# Patient Record
Sex: Female | Born: 1985
Health system: Southern US, Community
[De-identification: ages and names within clinical notes are randomized; demographics above are authoritative.]

## PROBLEM LIST (undated history)

## (undated) DIAGNOSIS — R87629 Unspecified abnormal cytological findings in specimens from vagina: Secondary | ICD-10-CM

## (undated) DIAGNOSIS — E7212 Methylenetetrahydrofolate reductase deficiency: Secondary | ICD-10-CM

## (undated) DIAGNOSIS — Z9889 Other specified postprocedural states: Secondary | ICD-10-CM

## (undated) DIAGNOSIS — R112 Nausea with vomiting, unspecified: Secondary | ICD-10-CM

## (undated) DIAGNOSIS — Z1589 Genetic susceptibility to other disease: Secondary | ICD-10-CM

## (undated) DIAGNOSIS — Z789 Other specified health status: Secondary | ICD-10-CM

## (undated) HISTORY — DX: Genetic susceptibility to other disease: Z15.89

## (undated) HISTORY — DX: Other specified postprocedural states: Z98.890

## (undated) HISTORY — DX: Unspecified abnormal cytological findings in specimens from vagina: R87.629

## (undated) HISTORY — DX: Other specified postprocedural states: R11.2

---
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
## 2007-01-13 HISTORY — PX: AUGMENTATION MAMMAPLASTY: SUR837

## 2015-04-23 DIAGNOSIS — Z6827 Body mass index (BMI) 27.0-27.9, adult: Secondary | ICD-10-CM | POA: Diagnosis not present

## 2015-04-23 DIAGNOSIS — E663 Overweight: Secondary | ICD-10-CM | POA: Diagnosis not present

## 2015-05-08 DIAGNOSIS — J0300 Acute streptococcal tonsillitis, unspecified: Secondary | ICD-10-CM | POA: Diagnosis not present

## 2015-06-05 DIAGNOSIS — R300 Dysuria: Secondary | ICD-10-CM | POA: Diagnosis not present

## 2015-06-05 DIAGNOSIS — Z6827 Body mass index (BMI) 27.0-27.9, adult: Secondary | ICD-10-CM | POA: Diagnosis not present

## 2015-06-05 DIAGNOSIS — N390 Urinary tract infection, site not specified: Secondary | ICD-10-CM | POA: Diagnosis not present

## 2015-08-22 DIAGNOSIS — E663 Overweight: Secondary | ICD-10-CM | POA: Diagnosis not present

## 2015-08-22 DIAGNOSIS — Z6827 Body mass index (BMI) 27.0-27.9, adult: Secondary | ICD-10-CM | POA: Diagnosis not present

## 2015-09-30 DIAGNOSIS — D2262 Melanocytic nevi of left upper limb, including shoulder: Secondary | ICD-10-CM | POA: Diagnosis not present

## 2015-09-30 DIAGNOSIS — L814 Other melanin hyperpigmentation: Secondary | ICD-10-CM | POA: Diagnosis not present

## 2015-09-30 DIAGNOSIS — L821 Other seborrheic keratosis: Secondary | ICD-10-CM | POA: Diagnosis not present

## 2016-01-17 DIAGNOSIS — L820 Inflamed seborrheic keratosis: Secondary | ICD-10-CM | POA: Diagnosis not present

## 2016-01-20 DIAGNOSIS — F411 Generalized anxiety disorder: Secondary | ICD-10-CM | POA: Diagnosis not present

## 2016-01-22 DIAGNOSIS — F411 Generalized anxiety disorder: Secondary | ICD-10-CM | POA: Diagnosis not present

## 2016-01-27 DIAGNOSIS — F411 Generalized anxiety disorder: Secondary | ICD-10-CM | POA: Diagnosis not present

## 2016-02-03 DIAGNOSIS — F411 Generalized anxiety disorder: Secondary | ICD-10-CM | POA: Diagnosis not present

## 2016-02-12 DIAGNOSIS — F411 Generalized anxiety disorder: Secondary | ICD-10-CM | POA: Diagnosis not present

## 2016-02-17 DIAGNOSIS — F411 Generalized anxiety disorder: Secondary | ICD-10-CM | POA: Diagnosis not present

## 2016-03-02 DIAGNOSIS — F411 Generalized anxiety disorder: Secondary | ICD-10-CM | POA: Diagnosis not present

## 2016-03-11 DIAGNOSIS — F411 Generalized anxiety disorder: Secondary | ICD-10-CM | POA: Diagnosis not present

## 2016-03-16 DIAGNOSIS — Z01419 Encounter for gynecological examination (general) (routine) without abnormal findings: Secondary | ICD-10-CM | POA: Diagnosis not present

## 2016-03-16 DIAGNOSIS — Z1321 Encounter for screening for nutritional disorder: Secondary | ICD-10-CM | POA: Diagnosis not present

## 2016-03-16 DIAGNOSIS — Z6829 Body mass index (BMI) 29.0-29.9, adult: Secondary | ICD-10-CM | POA: Diagnosis not present

## 2016-03-16 DIAGNOSIS — Z130 Encounter for screening for diseases of the blood and blood-forming organs and certain disorders involving the immune mechanism: Secondary | ICD-10-CM | POA: Diagnosis not present

## 2016-03-16 DIAGNOSIS — Z0000 Encounter for general adult medical examination without abnormal findings: Secondary | ICD-10-CM | POA: Diagnosis not present

## 2016-03-16 DIAGNOSIS — Z1329 Encounter for screening for other suspected endocrine disorder: Secondary | ICD-10-CM | POA: Diagnosis not present

## 2016-03-16 DIAGNOSIS — Z13220 Encounter for screening for lipoid disorders: Secondary | ICD-10-CM | POA: Diagnosis not present

## 2016-03-16 DIAGNOSIS — F411 Generalized anxiety disorder: Secondary | ICD-10-CM | POA: Diagnosis not present

## 2016-03-16 DIAGNOSIS — R87610 Atypical squamous cells of undetermined significance on cytologic smear of cervix (ASC-US): Secondary | ICD-10-CM | POA: Diagnosis not present

## 2016-03-30 DIAGNOSIS — F411 Generalized anxiety disorder: Secondary | ICD-10-CM | POA: Diagnosis not present

## 2016-04-02 DIAGNOSIS — M9901 Segmental and somatic dysfunction of cervical region: Secondary | ICD-10-CM | POA: Diagnosis not present

## 2016-04-02 DIAGNOSIS — M9902 Segmental and somatic dysfunction of thoracic region: Secondary | ICD-10-CM | POA: Diagnosis not present

## 2016-04-02 DIAGNOSIS — M9906 Segmental and somatic dysfunction of lower extremity: Secondary | ICD-10-CM | POA: Diagnosis not present

## 2016-04-02 DIAGNOSIS — M722 Plantar fascial fibromatosis: Secondary | ICD-10-CM | POA: Diagnosis not present

## 2016-04-08 DIAGNOSIS — F411 Generalized anxiety disorder: Secondary | ICD-10-CM | POA: Diagnosis not present

## 2016-04-09 DIAGNOSIS — M9902 Segmental and somatic dysfunction of thoracic region: Secondary | ICD-10-CM | POA: Diagnosis not present

## 2016-04-09 DIAGNOSIS — M9906 Segmental and somatic dysfunction of lower extremity: Secondary | ICD-10-CM | POA: Diagnosis not present

## 2016-04-09 DIAGNOSIS — M9901 Segmental and somatic dysfunction of cervical region: Secondary | ICD-10-CM | POA: Diagnosis not present

## 2016-04-09 DIAGNOSIS — M722 Plantar fascial fibromatosis: Secondary | ICD-10-CM | POA: Diagnosis not present

## 2016-04-21 DIAGNOSIS — F411 Generalized anxiety disorder: Secondary | ICD-10-CM | POA: Diagnosis not present

## 2016-04-29 DIAGNOSIS — F411 Generalized anxiety disorder: Secondary | ICD-10-CM | POA: Diagnosis not present

## 2016-05-02 DIAGNOSIS — F4323 Adjustment disorder with mixed anxiety and depressed mood: Secondary | ICD-10-CM | POA: Diagnosis not present

## 2016-05-09 DIAGNOSIS — F4323 Adjustment disorder with mixed anxiety and depressed mood: Secondary | ICD-10-CM | POA: Diagnosis not present

## 2016-05-16 DIAGNOSIS — F4323 Adjustment disorder with mixed anxiety and depressed mood: Secondary | ICD-10-CM | POA: Diagnosis not present

## 2016-05-30 DIAGNOSIS — F411 Generalized anxiety disorder: Secondary | ICD-10-CM | POA: Diagnosis not present

## 2016-09-01 ENCOUNTER — Encounter: Payer: Self-pay | Admitting: Sports Medicine

## 2016-09-01 ENCOUNTER — Ambulatory Visit (INDEPENDENT_AMBULATORY_CARE_PROVIDER_SITE_OTHER): Payer: BLUE CROSS/BLUE SHIELD

## 2016-09-01 ENCOUNTER — Telehealth: Payer: Self-pay | Admitting: *Deleted

## 2016-09-01 ENCOUNTER — Ambulatory Visit (INDEPENDENT_AMBULATORY_CARE_PROVIDER_SITE_OTHER): Payer: BLUE CROSS/BLUE SHIELD | Admitting: Sports Medicine

## 2016-09-01 VITALS — BP 116/74 | HR 74 | Resp 16 | Ht 71.0 in | Wt 204.0 lb

## 2016-09-01 DIAGNOSIS — S99921A Unspecified injury of right foot, initial encounter: Secondary | ICD-10-CM | POA: Diagnosis not present

## 2016-09-01 DIAGNOSIS — S86011A Strain of right Achilles tendon, initial encounter: Secondary | ICD-10-CM | POA: Diagnosis not present

## 2016-09-01 DIAGNOSIS — M79671 Pain in right foot: Secondary | ICD-10-CM

## 2016-09-01 NOTE — Progress Notes (Signed)
Subjective: Meghan Mason is a 31 y.o. female patient who presents to office for evaluation of Right heel pain. Patient complains of progressive pain since 1 month after a scavenger [place] event. States that she noticed swelling at the back of the heel. A anytime she tries to exert herself with exercise or physical activity. She notices some pain and a knot at the back of her heel. Patient reports initially she tried icing, however, has not tried much treatment since. Patient denies any other pedal complaints.   There are no active problems to display for this patient.   No current outpatient prescriptions on file prior to visit.   No current facility-administered medications on file prior to visit.     Allergies  Allergen Reactions  . Sulfamethoxazole-Trimethoprim Nausea And Vomiting    Objective:  General: Alert and oriented x3 in no acute distress  Dermatology: No open lesions bilateral lower extremities, no webspace macerations, no ecchymosis bilateral, all nails x 10 are well manicured.  Vascular: Dorsalis Pedis and Posterior Tibial pedal pulses 2/4, Capillary Fill Time 3 seconds, + pedal hair growth bilateral, no edema bilateral lower extremities, Temperature gradient within normal limits.  Neurology: Gross sensation intact via light touch bilateral, Tinels signBilateral.   Musculoskeletal: Mild tenderness with palpation at insertion of the Achilles on Right with a soft tissue nodule in the watershed area and decreased ankle rom with knee extending  vs flexed resembling gastroc equnius bilateral, The achilles tendon feels intact with no nodularity or palpable dell, Thompson sign negative, Subtalar and midtarsal joint range of motion is within normal limits, there is no 1st ray hypermobility or forefoot deformity noted bilateral.   Xrays  Right Foot    Impression: Normal osseous mineralization. Joint spaces preserved. No fracture/dislocation/boney destruction. Calcaneal spur  present. Kager's triangle intact with no obliteration. No other soft tissue abnormalities or radiopaque foreign bodies.   Assessment and Plan: Problem List Items Addressed This Visit    None    Visit Diagnoses    Foot injury, right, initial encounter    -  Primary   Relevant Orders   DG Foot Complete Right   Partial Achilles tendon tear, right, initial encounter       Pain of right heel         -Complete examination performed -Xrays reviewed -Discussed treatement optionsFor possible partial Achilles tear -Ordered MRI for further evaluation -Dispensed CAM boot and advised protection, rest, ice, elevation and compression. Dispensed a compression anklet for patient to use while ambulating in boot. -May take Motrin as needed for pain and inflammation -Patient to return to office after MRI or sooner if condition worsens.  Asencion Islam, DPM

## 2016-09-01 NOTE — Telephone Encounter (Addendum)
-----   Message from Asencion Islam, [place] sent at 09/01/2016  8:54 AM EDT ----- Regarding: MRI right ankle Eval for partial tear at achilles -Dr Marylene Land. Orders given to [facility] for pre-cert and faxed to [facility].09/04/2016-Pt called and asked when she could schedule the MRI. I told pt we were waiting on the prior authorization and to schedule at least 5 days from now. Pt states she will schedule the end of next week or beginning of September.

## 2016-09-01 NOTE — Progress Notes (Signed)
   Subjective:    Patient ID: Meghan Mason, female    DOB: 1985-07-29, 31 y.o.   MRN: [id number]  HPI     Review of Systems  Musculoskeletal: Positive for gait problem.  All other systems reviewed and are negative.      Objective:   Physical Exam        Assessment & Plan:

## 2016-09-18 ENCOUNTER — Ambulatory Visit
Admission: RE | Admit: 2016-09-18 | Discharge: 2016-09-18 | Disposition: A | Discharge status: Home / Self Care | Payer: BLUE CROSS/BLUE SHIELD | Source: Ambulatory Visit | Attending: Sports Medicine | Admitting: Sports Medicine

## 2016-09-18 DIAGNOSIS — S83241A Other tear of medial meniscus, current injury, right knee, initial encounter: Secondary | ICD-10-CM | POA: Diagnosis not present

## 2016-09-29 ENCOUNTER — Ambulatory Visit (INDEPENDENT_AMBULATORY_CARE_PROVIDER_SITE_OTHER): Payer: BLUE CROSS/BLUE SHIELD | Admitting: Sports Medicine

## 2016-09-29 ENCOUNTER — Encounter: Payer: Self-pay | Admitting: Sports Medicine

## 2016-09-29 DIAGNOSIS — M79671 Pain in right foot: Secondary | ICD-10-CM | POA: Diagnosis not present

## 2016-09-29 DIAGNOSIS — M7661 Achilles tendinitis, right leg: Secondary | ICD-10-CM | POA: Diagnosis not present

## 2016-09-29 MED ORDER — DICLOFENAC SODIUM 1 % TD GEL: 4.0000 g | Freq: Four times a day (QID) | TRANSDERMAL | 0 refills | Status: DC

## 2016-09-29 MED ORDER — METHYLPREDNISOLONE 4 MG PO TBPK: ORAL_TABLET | ORAL | 0 refills | Status: DC

## 2016-09-29 NOTE — Progress Notes (Signed)
Subjective: Meghan Mason is a 31 y.o. female patient who returns to office for follow up evaluation of Right heel pain. Patient states pain in about 5/10 with boot. Throbbing in nature. Reports the compression sleeve helps and is here to discuss MRI results. Patient denies any other pedal complaints.   There are no active problems to display for this patient.   No current outpatient prescriptions on file prior to visit.   No current facility-administered medications on file prior to visit.     Allergies  Allergen Reactions  . Sulfamethoxazole-Trimethoprim Nausea And Vomiting    Objective:  General: Alert and oriented x3 in no acute distress  Dermatology: No open lesions bilateral lower extremities, no webspace macerations, no ecchymosis bilateral, all nails x 10 are well manicured.  Vascular: Dorsalis Pedis and Posterior Tibial pedal pulses 2/4, Capillary Fill Time 3 seconds, + pedal hair growth bilateral, no edema bilateral lower extremities, Temperature gradient within normal limits.  Neurology: Michaell Cowing sensation intact via light touch bilateral, Tinels signBilateral.   Musculoskeletal: Mild tenderness with palpation at insertion of the Achilles on Right with a soft tissue nodule in the watershed area and decreased ankle rom with knee extending  vs flexed resembling gastroc equnius bilateral, The achilles tendon feels intact with no nodularity or palpable dell, Thompson sign negative, Subtalar and midtarsal joint range of motion is within normal limits, there is no 1st ray hypermobility or forefoot deformity noted bilateral. No pain at plantar fascia bilateral.   MRI 09-18-16 IMPRESSION: 1. Mild -moderate tendinosis of the distal Achilles tendon without a tear. 2. Moderate plantar fasciitis of the medial band of the plantar fascia with a small partial-thickness tear.  Assessment and Plan: Problem List Items Addressed This Visit    None    Visit Diagnoses    Tendonitis, Achilles,  right    -  Primary   Relevant Medications   methylPREDNISolone (MEDROL DOSEPAK) 4 MG TBPK tablet   diclofenac sodium (VOLTAREN) 1 % GEL   Pain of right heel       Relevant Medications   methylPREDNISolone (MEDROL DOSEPAK) 4 MG TBPK tablet   diclofenac sodium (VOLTAREN) 1 % GEL     -Complete examination performed -MRI reviewed -Discussed treatement options for tendonosis at achilles on right  -Rx Medrol dose pack and topical voltaren to use as instructed -Wean from CAM boot to tennis shoe with heel lift and advised protection, rest, ice, elevation and compression with anklet. -May take Motrin as needed for pain and inflammation -Advised patient that after 1 week may attempt walking or light jog for exercise -Patient to return to office in 3 weeks to check to see how she is doing and decide if we can increase her exercise activities or sooner if condition worsens.  Asencion Islam, DPM

## 2016-10-27 ENCOUNTER — Ambulatory Visit: Payer: BLUE CROSS/BLUE SHIELD | Admitting: Sports Medicine

## 2016-11-02 DIAGNOSIS — O209 Hemorrhage in early pregnancy, unspecified: Secondary | ICD-10-CM | POA: Diagnosis not present

## 2016-11-02 DIAGNOSIS — Z3201 Encounter for pregnancy test, result positive: Secondary | ICD-10-CM | POA: Diagnosis not present

## 2016-11-02 DIAGNOSIS — Z3A01 Less than 8 weeks gestation of pregnancy: Secondary | ICD-10-CM | POA: Diagnosis not present

## 2016-11-02 DIAGNOSIS — O09891 Supervision of other high risk pregnancies, first trimester: Secondary | ICD-10-CM | POA: Diagnosis not present

## 2016-11-04 DIAGNOSIS — Z3A01 Less than 8 weeks gestation of pregnancy: Secondary | ICD-10-CM | POA: Diagnosis not present

## 2016-11-04 DIAGNOSIS — O209 Hemorrhage in early pregnancy, unspecified: Secondary | ICD-10-CM | POA: Diagnosis not present

## 2016-12-01 ENCOUNTER — Ambulatory Visit: Payer: BLUE CROSS/BLUE SHIELD | Admitting: Sports Medicine

## 2017-02-22 DIAGNOSIS — Z6731 Type AB blood, Rh negative: Secondary | ICD-10-CM | POA: Diagnosis not present

## 2017-02-22 DIAGNOSIS — Z3202 Encounter for pregnancy test, result negative: Secondary | ICD-10-CM | POA: Diagnosis not present

## 2017-03-08 DIAGNOSIS — Z130 Encounter for screening for diseases of the blood and blood-forming organs and certain disorders involving the immune mechanism: Secondary | ICD-10-CM | POA: Diagnosis not present

## 2017-03-18 DIAGNOSIS — E7212 Methylenetetrahydrofolate reductase deficiency: Secondary | ICD-10-CM | POA: Diagnosis not present

## 2017-04-15 DIAGNOSIS — Z6832 Body mass index (BMI) 32.0-32.9, adult: Secondary | ICD-10-CM | POA: Diagnosis not present

## 2017-04-15 DIAGNOSIS — R87610 Atypical squamous cells of undetermined significance on cytologic smear of cervix (ASC-US): Secondary | ICD-10-CM | POA: Diagnosis not present

## 2017-04-15 DIAGNOSIS — N96 Recurrent pregnancy loss: Secondary | ICD-10-CM | POA: Diagnosis not present

## 2017-04-15 DIAGNOSIS — Z01419 Encounter for gynecological examination (general) (routine) without abnormal findings: Secondary | ICD-10-CM | POA: Diagnosis not present

## 2017-04-15 DIAGNOSIS — Z1151 Encounter for screening for human papillomavirus (HPV): Secondary | ICD-10-CM | POA: Diagnosis not present

## 2017-07-06 DIAGNOSIS — G8911 Acute pain due to trauma: Secondary | ICD-10-CM | POA: Diagnosis not present

## 2017-07-06 DIAGNOSIS — S99921A Unspecified injury of right foot, initial encounter: Secondary | ICD-10-CM | POA: Diagnosis not present

## 2017-07-06 DIAGNOSIS — S93601A Unspecified sprain of right foot, initial encounter: Secondary | ICD-10-CM | POA: Diagnosis not present

## 2017-07-12 DIAGNOSIS — Z3201 Encounter for pregnancy test, result positive: Secondary | ICD-10-CM | POA: Diagnosis not present

## 2017-07-12 DIAGNOSIS — Z8759 Personal history of other complications of pregnancy, childbirth and the puerperium: Secondary | ICD-10-CM | POA: Diagnosis not present

## 2017-07-14 DIAGNOSIS — Z8759 Personal history of other complications of pregnancy, childbirth and the puerperium: Secondary | ICD-10-CM | POA: Diagnosis not present

## 2017-07-19 DIAGNOSIS — O0281 Inappropriate change in quantitative human chorionic gonadotropin (hCG) in early pregnancy: Secondary | ICD-10-CM | POA: Diagnosis not present

## 2017-08-23 DIAGNOSIS — N96 Recurrent pregnancy loss: Secondary | ICD-10-CM | POA: Diagnosis not present

## 2017-08-27 DIAGNOSIS — N96 Recurrent pregnancy loss: Secondary | ICD-10-CM | POA: Diagnosis not present

## 2017-09-14 DIAGNOSIS — J029 Acute pharyngitis, unspecified: Secondary | ICD-10-CM | POA: Diagnosis not present

## 2017-09-20 DIAGNOSIS — J029 Acute pharyngitis, unspecified: Secondary | ICD-10-CM | POA: Diagnosis not present

## 2017-11-22 DIAGNOSIS — J069 Acute upper respiratory infection, unspecified: Secondary | ICD-10-CM | POA: Diagnosis not present

## 2017-11-22 DIAGNOSIS — R05 Cough: Secondary | ICD-10-CM | POA: Diagnosis not present

## 2017-11-22 DIAGNOSIS — Z6831 Body mass index (BMI) 31.0-31.9, adult: Secondary | ICD-10-CM | POA: Diagnosis not present

## 2018-02-18 DIAGNOSIS — Z0000 Encounter for general adult medical examination without abnormal findings: Secondary | ICD-10-CM | POA: Diagnosis not present

## 2018-02-18 DIAGNOSIS — R82998 Other abnormal findings in urine: Secondary | ICD-10-CM | POA: Diagnosis not present

## 2018-02-22 DIAGNOSIS — Z8349 Family history of other endocrine, nutritional and metabolic diseases: Secondary | ICD-10-CM | POA: Diagnosis not present

## 2018-02-22 DIAGNOSIS — Z1331 Encounter for screening for depression: Secondary | ICD-10-CM | POA: Diagnosis not present

## 2018-02-22 DIAGNOSIS — Z0000 Encounter for general adult medical examination without abnormal findings: Secondary | ICD-10-CM | POA: Diagnosis not present

## 2018-02-22 DIAGNOSIS — J3089 Other allergic rhinitis: Secondary | ICD-10-CM | POA: Diagnosis not present

## 2018-02-22 DIAGNOSIS — K589 Irritable bowel syndrome without diarrhea: Secondary | ICD-10-CM | POA: Diagnosis not present

## 2018-02-22 DIAGNOSIS — M722 Plantar fascial fibromatosis: Secondary | ICD-10-CM | POA: Diagnosis not present

## 2018-06-20 DIAGNOSIS — Z01419 Encounter for gynecological examination (general) (routine) without abnormal findings: Secondary | ICD-10-CM | POA: Diagnosis not present

## 2018-06-20 DIAGNOSIS — Z6832 Body mass index (BMI) 32.0-32.9, adult: Secondary | ICD-10-CM | POA: Diagnosis not present

## 2018-06-23 DIAGNOSIS — C44319 Basal cell carcinoma of skin of other parts of face: Secondary | ICD-10-CM | POA: Diagnosis not present

## 2018-06-23 DIAGNOSIS — D225 Melanocytic nevi of trunk: Secondary | ICD-10-CM | POA: Diagnosis not present

## 2018-06-23 DIAGNOSIS — D2372 Other benign neoplasm of skin of left lower limb, including hip: Secondary | ICD-10-CM | POA: Diagnosis not present

## 2018-06-23 DIAGNOSIS — L812 Freckles: Secondary | ICD-10-CM | POA: Diagnosis not present

## 2018-06-23 DIAGNOSIS — L821 Other seborrheic keratosis: Secondary | ICD-10-CM | POA: Diagnosis not present

## 2018-07-19 DIAGNOSIS — C44319 Basal cell carcinoma of skin of other parts of face: Secondary | ICD-10-CM | POA: Diagnosis not present

## 2018-08-01 DIAGNOSIS — N644 Mastodynia: Secondary | ICD-10-CM | POA: Diagnosis not present

## 2018-09-07 ENCOUNTER — Other Ambulatory Visit: Payer: Self-pay | Admitting: Obstetrics and Gynecology

## 2018-09-07 DIAGNOSIS — N6310 Unspecified lump in the right breast, unspecified quadrant: Secondary | ICD-10-CM | POA: Diagnosis not present

## 2018-09-07 DIAGNOSIS — N63 Unspecified lump in unspecified breast: Secondary | ICD-10-CM

## 2018-09-08 ENCOUNTER — Ambulatory Visit
Admission: RE | Admit: 2018-09-08 | Discharge: 2018-09-08 | Disposition: A | Discharge status: Home / Self Care | Payer: BLUE CROSS/BLUE SHIELD | Source: Ambulatory Visit | Attending: Obstetrics and Gynecology | Admitting: Obstetrics and Gynecology

## 2018-09-08 ENCOUNTER — Other Ambulatory Visit: Payer: Self-pay

## 2018-09-08 ENCOUNTER — Other Ambulatory Visit: Payer: Self-pay | Admitting: Obstetrics and Gynecology

## 2018-09-08 DIAGNOSIS — N63 Unspecified lump in unspecified breast: Secondary | ICD-10-CM

## 2018-09-08 DIAGNOSIS — N6489 Other specified disorders of breast: Secondary | ICD-10-CM | POA: Diagnosis not present

## 2018-09-08 DIAGNOSIS — R928 Other abnormal and inconclusive findings on diagnostic imaging of breast: Secondary | ICD-10-CM | POA: Diagnosis not present

## 2018-09-16 DIAGNOSIS — O09291 Supervision of pregnancy with other poor reproductive or obstetric history, first trimester: Secondary | ICD-10-CM | POA: Diagnosis not present

## 2018-09-16 DIAGNOSIS — Z3A01 Less than 8 weeks gestation of pregnancy: Secondary | ICD-10-CM | POA: Diagnosis not present

## 2018-09-16 DIAGNOSIS — Z3200 Encounter for pregnancy test, result unknown: Secondary | ICD-10-CM | POA: Diagnosis not present

## 2018-09-16 DIAGNOSIS — Z3689 Encounter for other specified antenatal screening: Secondary | ICD-10-CM | POA: Diagnosis not present

## 2018-09-20 DIAGNOSIS — O09299 Supervision of pregnancy with other poor reproductive or obstetric history, unspecified trimester: Secondary | ICD-10-CM | POA: Diagnosis not present

## 2018-09-22 DIAGNOSIS — O09291 Supervision of pregnancy with other poor reproductive or obstetric history, first trimester: Secondary | ICD-10-CM | POA: Diagnosis not present

## 2018-09-22 DIAGNOSIS — Z3A01 Less than 8 weeks gestation of pregnancy: Secondary | ICD-10-CM | POA: Diagnosis not present

## 2018-10-13 DIAGNOSIS — Z3201 Encounter for pregnancy test, result positive: Secondary | ICD-10-CM | POA: Diagnosis not present

## 2018-10-21 DIAGNOSIS — Z118 Encounter for screening for other infectious and parasitic diseases: Secondary | ICD-10-CM | POA: Diagnosis not present

## 2018-10-21 DIAGNOSIS — O09521 Supervision of elderly multigravida, first trimester: Secondary | ICD-10-CM | POA: Diagnosis not present

## 2018-10-21 DIAGNOSIS — Z3A09 9 weeks gestation of pregnancy: Secondary | ICD-10-CM | POA: Diagnosis not present

## 2018-10-21 DIAGNOSIS — Z23 Encounter for immunization: Secondary | ICD-10-CM | POA: Diagnosis not present

## 2018-10-21 DIAGNOSIS — Z3689 Encounter for other specified antenatal screening: Secondary | ICD-10-CM | POA: Diagnosis not present

## 2018-10-21 LAB — OB RESULTS CONSOLE ABO/RH: RH Type: NEGATIVE

## 2018-10-21 LAB — OB RESULTS CONSOLE HIV ANTIBODY (ROUTINE TESTING): HIV: NONREACTIVE

## 2018-10-21 LAB — OB RESULTS CONSOLE ANTIBODY SCREEN: Antibody Screen: NEGATIVE

## 2018-10-21 LAB — OB RESULTS CONSOLE HEPATITIS B SURFACE ANTIGEN: Hepatitis B Surface Ag: NEGATIVE

## 2018-10-21 LAB — OB RESULTS CONSOLE RUBELLA ANTIBODY, IGM: Rubella: IMMUNE

## 2018-10-21 LAB — OB RESULTS CONSOLE RPR: RPR: NONREACTIVE

## 2018-10-31 DIAGNOSIS — O09299 Supervision of pregnancy with other poor reproductive or obstetric history, unspecified trimester: Secondary | ICD-10-CM | POA: Diagnosis not present

## 2018-10-31 DIAGNOSIS — Z3A10 10 weeks gestation of pregnancy: Secondary | ICD-10-CM | POA: Diagnosis not present

## 2018-10-31 DIAGNOSIS — E7212 Methylenetetrahydrofolate reductase deficiency: Secondary | ICD-10-CM | POA: Diagnosis not present

## 2018-10-31 DIAGNOSIS — Z3689 Encounter for other specified antenatal screening: Secondary | ICD-10-CM | POA: Diagnosis not present

## 2018-11-11 ENCOUNTER — Other Ambulatory Visit: Payer: Self-pay

## 2018-11-11 DIAGNOSIS — Z20822 Contact with and (suspected) exposure to covid-19: Secondary | ICD-10-CM

## 2018-11-12 LAB — NOVEL CORONAVIRUS, NAA: SARS-CoV-2, NAA: NOT DETECTED

## 2018-11-20 ENCOUNTER — Inpatient Hospital Stay (HOSPITAL_COMMUNITY): Payer: BC Managed Care – PPO

## 2018-11-20 ENCOUNTER — Inpatient Hospital Stay (HOSPITAL_COMMUNITY)
Admission: AD | Admit: 2018-11-20 | Discharge: 2018-11-20 | Disposition: A | Discharge status: Home / Self Care | Payer: BC Managed Care – PPO | Attending: Obstetrics and Gynecology | Admitting: Obstetrics and Gynecology

## 2018-11-20 ENCOUNTER — Other Ambulatory Visit: Payer: Self-pay

## 2018-11-20 ENCOUNTER — Encounter (HOSPITAL_COMMUNITY): Payer: Self-pay

## 2018-11-20 DIAGNOSIS — O4451 Low lying placenta with hemorrhage, first trimester: Secondary | ICD-10-CM | POA: Insufficient documentation

## 2018-11-20 DIAGNOSIS — N939 Abnormal uterine and vaginal bleeding, unspecified: Secondary | ICD-10-CM

## 2018-11-20 DIAGNOSIS — O208 Other hemorrhage in early pregnancy: Secondary | ICD-10-CM | POA: Diagnosis not present

## 2018-11-20 DIAGNOSIS — Z3A13 13 weeks gestation of pregnancy: Secondary | ICD-10-CM | POA: Diagnosis not present

## 2018-11-20 DIAGNOSIS — O4441 Low lying placenta NOS or without hemorrhage, first trimester: Secondary | ICD-10-CM | POA: Diagnosis not present

## 2018-11-20 DIAGNOSIS — O209 Hemorrhage in early pregnancy, unspecified: Secondary | ICD-10-CM | POA: Diagnosis not present

## 2018-11-20 DIAGNOSIS — O468X1 Other antepartum hemorrhage, first trimester: Secondary | ICD-10-CM | POA: Diagnosis not present

## 2018-11-20 DIAGNOSIS — O418X10 Other specified disorders of amniotic fluid and membranes, first trimester, not applicable or unspecified: Secondary | ICD-10-CM | POA: Diagnosis not present

## 2018-11-20 HISTORY — DX: Other specified health status: Z78.9

## 2018-11-20 LAB — OB RESULTS CONSOLE GC/CHLAMYDIA
Chlamydia: NEGATIVE
Gonorrhea: NEGATIVE

## 2018-11-20 LAB — URINALYSIS, ROUTINE W REFLEX MICROSCOPIC
Bilirubin Urine: NEGATIVE
Glucose, UA: NEGATIVE mg/dL
Hgb urine dipstick: NEGATIVE
Ketones, ur: NEGATIVE mg/dL
Leukocytes,Ua: NEGATIVE
Nitrite: NEGATIVE
Protein, ur: NEGATIVE mg/dL
Specific Gravity, Urine: 1.004 — ABNORMAL LOW (ref 1.005–1.030)
pH: 6 (ref 5.0–8.0)

## 2018-11-20 LAB — WET PREP, GENITAL
Clue Cells Wet Prep HPF POC: NONE SEEN
Sperm: NONE SEEN
Trich, Wet Prep: NONE SEEN
Yeast Wet Prep HPF POC: NONE SEEN

## 2018-11-20 NOTE — MAU Provider Note (Signed)
History     CSN: [id number]  Arrival date and time: 11/20/18 1406   First Provider Initiated Contact with Patient 11/20/18 1503      Chief Complaint  Patient presents with  . Vaginal Bleeding   Meghan Mason is a 33 y.o. G5P1 at [redacted]w[redacted]d who presents to MAU with complaints of vaginal bleeding. She reports taking a shower and noticing 5cm spot on towel. She reports having some more spotting when she wiped with toilet paper after getting out of the shower. Has a hx of miscarriages x3 - currently taking vaginal progesterone. She reports having occasional LLQ cramping yesterday but denies pain today. Receives prenatal care at [facility].    OB History    Gravida  5   Para  1   Term  1   Preterm      AB  3   Living        SAB  3   TAB      Ectopic      Multiple      Live Births              Past Medical History:  Diagnosis Date  . Medical history non-contributory     Past Surgical History:  Procedure Laterality Date  . AUGMENTATION MAMMAPLASTY Bilateral 2009    Family History  Problem Relation Age of Onset  . Breast cancer Maternal Aunt 33    Social History   Tobacco Use  . Smoking status: Never Smoker  . Smokeless tobacco: Never Used  Substance Use Topics  . Alcohol use: Not Currently  . Drug use: Never    Allergies:  Allergies  Allergen Reactions  . Sulfamethoxazole-Trimethoprim Nausea And Vomiting    Medications Prior to Admission  Medication Sig Dispense Refill Last Dose  . diclofenac sodium (VOLTAREN) 1 % GEL Apply 4 g topically 4 (four) times daily. Rub topically to area of heel pain 100 g 0   . methylPREDNISolone (MEDROL DOSEPAK) 4 MG TBPK tablet Take as instructed 21 tablet 0     Review of Systems  Constitutional: Negative.   Respiratory: Negative.   Cardiovascular: Negative.   Gastrointestinal: Positive for abdominal pain. Negative for constipation, diarrhea, nausea and vomiting.  Genitourinary: Positive for vaginal  bleeding. Negative for difficulty urinating, dysuria, frequency, hematuria, pelvic pain and urgency.  Musculoskeletal: Negative.   Neurological: Negative.    Physical Exam   Blood pressure 126/77, pulse 94, temperature 98.1 F (36.7 C), temperature source Oral, height 5\' 11"  (1.803 m), weight 96.6 kg, last menstrual period 08/18/2018, SpO2 100 %.  Physical Exam  Nursing note and vitals reviewed. Constitutional: She is oriented to person, place, and time. She appears well-developed and well-nourished. No distress.  Patient is very anxious   Cardiovascular: Normal rate and regular rhythm.  Respiratory: Effort normal and breath sounds normal. No respiratory distress. She has no wheezes.  GI: Soft. There is no abdominal tenderness. There is no rebound and no guarding.  Genitourinary: Cervix exhibits friability.    Vaginal discharge and bleeding present.  There is bleeding in the vagina.    Genitourinary Comments: Pelvic exam: Cervix pink- friable with manipulation of speculum, visually closed, without lesion, scant white creamy discharge, scant amount of bright red mucous bleeding at cervical os, vaginal walls and external genitalia normal Bimanual exam: Cervix 0/long/high, firm, anterior, neg CMT, uterus nontender, nonenlarged, adnexa without tenderness, enlargement, or mass   Musculoskeletal: Normal range of motion.        General: No  edema.  Neurological: She is alert and oriented to person, place, and time.  Skin: Skin is warm and dry.  Psychiatric: She has a normal mood and affect. Her behavior is normal. Thought content normal.   FHR 160 by doppler   MAU Course  Procedures  MDM Orders Placed This Encounter  Procedures  . Wet prep, genital  . US OB LESS THAN 14 WEEKS WITH OB TRANSVAGINAL  . Urinalysis, Routine w reflex microscopic   Labs and US report reviewed:  Results for orders placed or performed during the hospital encounter of 11/20/18 (from the past 24 hour(s))   Urinalysis, Routine w reflex microscopic     Status: Abnormal   Collection Time: 11/20/18  2:41 PM  Result Value Ref Range   Color, Urine STRAW (A) YELLOW   APPearance CLEAR CLEAR   Specific Gravity, Urine 1.004 (L) 1.005 - 1.030   pH 6.0 5.0 - 8.0   Glucose, UA NEGATIVE NEGATIVE mg/dL   Hgb urine dipstick NEGATIVE NEGATIVE   Bilirubin Urine NEGATIVE NEGATIVE   Ketones, ur NEGATIVE NEGATIVE mg/dL   Protein, ur NEGATIVE NEGATIVE mg/dL   Nitrite NEGATIVE NEGATIVE   Leukocytes,Ua NEGATIVE NEGATIVE  Wet prep, genital     Status: Abnormal   Collection Time: 11/20/18  3:29 PM  Result Value Ref Range   Yeast Wet Prep HPF POC NONE SEEN NONE SEEN   Trich, Wet Prep NONE SEEN NONE SEEN   Clue Cells Wet Prep HPF POC NONE SEEN NONE SEEN   WBC, Wet Prep HPF POC MANY (A) NONE SEEN   Sperm NONE SEEN    [place] Ob Comp Less 14 Wks  Result Date: 11/20/2018 CLINICAL DATA:  Pregnant, vaginal bleeding EXAM: OBSTETRIC <14 WK ULTRASOUND TECHNIQUE: Transabdominal ultrasound was performed for evaluation of the gestation as well as the maternal uterus and adnexal regions. COMPARISON:  None. FINDINGS: Intrauterine gestational sac: Single Yolk sac:  Not Visualized. Embryo:  Visualized. Cardiac Activity: Visualized. Heart Rate: 168 bpm CRL:   77.4 mm   13 w 6 d                  US EDC: 05/22/2019 Subchorionic hemorrhage:  Small subchronic hemorrhage. Maternal uterus/adnexae: Low lying placenta, currently covering the cervical os. Bilateral ovaries are within normal limits. No free fluid. IMPRESSION: Single live intrauterine gestation, with estimated gestational age [redacted] weeks 6 days, as above. Low-lying placenta, currently covering the cervical os. Attention at the time of 20 week fetal anatomic survey is suggested. Electronically Signed   By: Charline BillsSriyesh  Krishnan M.D.   On: 11/20/2018 16:22   Discussed results of US with patient. [facility] and what to expect. Educated on low lying placenta vs previa. Reviewed  precautions for [facility] and low lying placenta. Educated on [facility] if placenta continues to be covering cervical os then will be diagnosed as placental previa. Answered patient's questions.   Discussed pelvic rest, abstaining from extraneous activity and exercise. Follow up as scheduled with Dr Billy Coastaavon on Wednesday. Discussed reasons to return to MAU. Return to MAU as needed. Pt stable at time of discharge.   Assessment and Plan   1. Subchorionic hematoma in first trimester, single or unspecified fetus   2. Vaginal spotting   3. Low-lying placenta in first trimester   4. [redacted] weeks gestation of pregnancy    Discharge home Follow up as scheduled in the office for prenatal care Return to MAU as  needed for reasons discussed and/or emergencies  Pelvic rest  [facility] and Low placenta precautions/ bleeding precautions   Follow-up Information    Brien Few, MD Follow up on 11/23/2018.   Specialty: Obstetrics and Gynecology Why: Follow up as scheduled for prenatal appointment on Wednesday  Contact information: [place] [id number] [place] 11/20/2018, 5:18 PM

## 2018-11-20 NOTE — MAU Note (Signed)
Meghan Mason is a 33 y.o. here in MAU reporting: today after taking a shower she noticed some blood on the towel. Has not checked for more bleeding, states doctor told her to come in. No pain. No recent IC. Is on progesterone. States she sees Dr. Ronita Hipps in the office and is about [redacted]weeks pregnant.   Onset of complaint: today  Pain score: 0/10  Vitals:   11/20/18 1430  BP: 126/77  Pulse: 94  Temp: 98.1 F (36.7 C)  SpO2: 100%     FHT: 160  Lab orders placed from triage: UA

## 2018-11-20 NOTE — Discharge Instructions (Signed)
Activity Restriction During Pregnancy °Your health care provider may recommend specific activity restrictions during pregnancy for a variety of reasons. Activity restriction may require that you limit activities that require great effort, such as exercise, lifting, or sex. °The type of activity restriction will vary for each person, depending on your risk or the problems you are having. Activity restriction may be recommended for a period of time until your baby is delivered. °Why are activity restrictions recommended? °Activity restriction may be recommended if: °· Your placenta is partially or completely covering the opening of your cervix (placenta previa). °· There is bleeding between the wall of the uterus and the amniotic sac in the first trimester of pregnancy (subchorionic hemorrhage). °· You went into labor too early (preterm labor). °· You have a history of miscarriage. °· You have a condition that causes high blood pressure during pregnancy (preeclampsia or eclampsia). °· You are pregnant with more than one baby. °· Your baby is not growing well. °What are the risks? °The risks depend on your specific restriction. Strict bed rest has the most physical and emotional risks and is no longer routinely recommended. Risks of strict bed rest include: °· Loss of muscle conditioning from not moving. °· Blood clots. °· Social isolation. °· Depression. °· Loss of income. °Talk with your health care team about activity restriction to decide if it is best for you and your baby. Even if you are having problems during your pregnancy, you may be able to continue with normal levels of activity with careful monitoring by your health care team. °Follow these instructions at home: °If needed, based on your overall health and the health of your baby, your health care provider will decide which type of activity restriction is right for you. Activity restrictions may include: °· Not lifting anything heavier than 10 pounds (4.5  kg). °· Avoiding activities that take a lot of physical effort. °· No lifting or straining. °· Resting in a sitting position or lying down for periods of time during the day. °Pelvic rest may be recommended along with activity restrictions. If pelvic rest is recommended, then: °· Do not have sex, an orgasm, or use sexual stimulation. °· Do not use tampons. Do not douche. Do not put anything into your vagina. °· Do not lift anything that is heavier than 10 lb (4.5 kg). °· Avoid activities that require a lot of effort. °· Avoid any activity in which your pelvic muscles could become strained, such as squatting. °Questions to ask your health care provider °· Why is my activity being limited? °· How will activity restrictions affect my body? °· Why is rest helpful for me and my baby? °· What activities can I do? °· When can I return to normal activities? °When should I seek immediate medical care? °Seek immediate medical care if you have: °· Vaginal bleeding. °· Vaginal discharge. °· Cramping pain in your lower abdomen. °· Regular contractions. °· A low, dull backache. °Summary °· Your health care provider may recommend specific activity restrictions during pregnancy for a variety of reasons. °· Activity restriction may require that you limit activities such as exercise, lifting, sex, or any other activity that requires great effort. °· Discuss the risks and benefits of activity restriction with your health care team to decide if it is best for you and your baby. °· Contact your health care provider right away if you think you are having contractions, or if you notice vaginal bleeding, discharge, or cramping. °This information is not   intended to replace advice given to you by your health care provider. Make sure you discuss any questions you have with your health care provider. °Document Released: 04/25/2010 Document Revised: 04/20/2017 Document Reviewed: 04/20/2017 °Elsevier Patient Education © 2020 [employer] Inc[employer] ° °

## 2018-11-21 LAB — GC/CHLAMYDIA PROBE AMP (~~LOC~~) NOT AT ARMC
Chlamydia: NEGATIVE
Comment: NEGATIVE
Comment: NORMAL
Neisseria Gonorrhea: NEGATIVE

## 2018-11-23 DIAGNOSIS — Z3A13 13 weeks gestation of pregnancy: Secondary | ICD-10-CM | POA: Diagnosis not present

## 2018-11-23 DIAGNOSIS — O360110 Maternal care for anti-D [Rh] antibodies, first trimester, not applicable or unspecified: Secondary | ICD-10-CM | POA: Diagnosis not present

## 2018-11-23 DIAGNOSIS — O09291 Supervision of pregnancy with other poor reproductive or obstetric history, first trimester: Secondary | ICD-10-CM | POA: Diagnosis not present

## 2018-12-12 DIAGNOSIS — Z361 Encounter for antenatal screening for raised alphafetoprotein level: Secondary | ICD-10-CM | POA: Diagnosis not present

## 2018-12-12 DIAGNOSIS — Z3A16 16 weeks gestation of pregnancy: Secondary | ICD-10-CM | POA: Diagnosis not present

## 2018-12-12 DIAGNOSIS — O09291 Supervision of pregnancy with other poor reproductive or obstetric history, first trimester: Secondary | ICD-10-CM | POA: Diagnosis not present

## 2018-12-12 DIAGNOSIS — O360120 Maternal care for anti-D [Rh] antibodies, second trimester, not applicable or unspecified: Secondary | ICD-10-CM | POA: Diagnosis not present

## 2018-12-29 DIAGNOSIS — Z3A19 19 weeks gestation of pregnancy: Secondary | ICD-10-CM | POA: Diagnosis not present

## 2018-12-29 DIAGNOSIS — O09522 Supervision of elderly multigravida, second trimester: Secondary | ICD-10-CM | POA: Diagnosis not present

## 2019-01-03 DIAGNOSIS — Z3A19 19 weeks gestation of pregnancy: Secondary | ICD-10-CM | POA: Diagnosis not present

## 2019-01-03 DIAGNOSIS — O26852 Spotting complicating pregnancy, second trimester: Secondary | ICD-10-CM | POA: Diagnosis not present

## 2019-01-13 NOTE — L&D Delivery Note (Signed)
Delivery Note At 5:00 PM a viable and healthy female was delivered via Vaginal, Spontaneous (Presentation:  LoA    ).  APGAR: 8, 9; weight  pending.   Placenta status: Spontaneous, Intact.  Cord: 3 vessels with the following complications: None.  Cord pH: na  Anesthesia: Epidural Episiotomy: None Lacerations: 2nd degree;Vaginal Suture Repair: 2.0 vicryl rapide Est. Blood Loss (mL): 139  Mom to postpartum.  Baby to Couplet care / Skin to Skin.  Obed Samek J 05/22/2019, 5:20 PM

## 2019-01-23 DIAGNOSIS — O09522 Supervision of elderly multigravida, second trimester: Secondary | ICD-10-CM | POA: Diagnosis not present

## 2019-01-23 DIAGNOSIS — Z3A22 22 weeks gestation of pregnancy: Secondary | ICD-10-CM | POA: Diagnosis not present

## 2019-01-26 DIAGNOSIS — B078 Other viral warts: Secondary | ICD-10-CM | POA: Diagnosis not present

## 2019-01-26 DIAGNOSIS — Z85828 Personal history of other malignant neoplasm of skin: Secondary | ICD-10-CM | POA: Diagnosis not present

## 2019-01-26 DIAGNOSIS — D2262 Melanocytic nevi of left upper limb, including shoulder: Secondary | ICD-10-CM | POA: Diagnosis not present

## 2019-01-26 DIAGNOSIS — D2261 Melanocytic nevi of right upper limb, including shoulder: Secondary | ICD-10-CM | POA: Diagnosis not present

## 2019-01-26 DIAGNOSIS — L821 Other seborrheic keratosis: Secondary | ICD-10-CM | POA: Diagnosis not present

## 2019-02-21 DIAGNOSIS — O09522 Supervision of elderly multigravida, second trimester: Secondary | ICD-10-CM | POA: Diagnosis not present

## 2019-02-21 DIAGNOSIS — Z3689 Encounter for other specified antenatal screening: Secondary | ICD-10-CM | POA: Diagnosis not present

## 2019-02-21 DIAGNOSIS — Z3A26 26 weeks gestation of pregnancy: Secondary | ICD-10-CM | POA: Diagnosis not present

## 2019-02-28 DIAGNOSIS — Z3482 Encounter for supervision of other normal pregnancy, second trimester: Secondary | ICD-10-CM | POA: Diagnosis not present

## 2019-02-28 DIAGNOSIS — Z3483 Encounter for supervision of other normal pregnancy, third trimester: Secondary | ICD-10-CM | POA: Diagnosis not present

## 2019-03-09 DIAGNOSIS — Z3A29 29 weeks gestation of pregnancy: Secondary | ICD-10-CM | POA: Diagnosis not present

## 2019-03-09 DIAGNOSIS — O360130 Maternal care for anti-D [Rh] antibodies, third trimester, not applicable or unspecified: Secondary | ICD-10-CM | POA: Diagnosis not present

## 2019-03-09 DIAGNOSIS — Z3689 Encounter for other specified antenatal screening: Secondary | ICD-10-CM | POA: Diagnosis not present

## 2019-03-09 DIAGNOSIS — Z23 Encounter for immunization: Secondary | ICD-10-CM | POA: Diagnosis not present

## 2019-03-09 DIAGNOSIS — O09523 Supervision of elderly multigravida, third trimester: Secondary | ICD-10-CM | POA: Diagnosis not present

## 2019-03-24 DIAGNOSIS — Z3A31 31 weeks gestation of pregnancy: Secondary | ICD-10-CM | POA: Diagnosis not present

## 2019-03-24 DIAGNOSIS — O360130 Maternal care for anti-D [Rh] antibodies, third trimester, not applicable or unspecified: Secondary | ICD-10-CM | POA: Diagnosis not present

## 2019-04-06 DIAGNOSIS — O360130 Maternal care for anti-D [Rh] antibodies, third trimester, not applicable or unspecified: Secondary | ICD-10-CM | POA: Diagnosis not present

## 2019-04-06 DIAGNOSIS — Z3A33 33 weeks gestation of pregnancy: Secondary | ICD-10-CM | POA: Diagnosis not present

## 2019-04-20 DIAGNOSIS — Z3A35 35 weeks gestation of pregnancy: Secondary | ICD-10-CM | POA: Diagnosis not present

## 2019-04-20 DIAGNOSIS — O321XX3 Maternal care for breech presentation, fetus 3: Secondary | ICD-10-CM | POA: Diagnosis not present

## 2019-04-20 DIAGNOSIS — Z3685 Encounter for antenatal screening for Streptococcus B: Secondary | ICD-10-CM | POA: Diagnosis not present

## 2019-04-24 LAB — OB RESULTS CONSOLE GBS: GBS: POSITIVE

## 2019-04-27 DIAGNOSIS — O09293 Supervision of pregnancy with other poor reproductive or obstetric history, third trimester: Secondary | ICD-10-CM | POA: Diagnosis not present

## 2019-04-27 DIAGNOSIS — Z3A36 36 weeks gestation of pregnancy: Secondary | ICD-10-CM | POA: Diagnosis not present

## 2019-05-02 DIAGNOSIS — O3663X0 Maternal care for excessive fetal growth, third trimester, not applicable or unspecified: Secondary | ICD-10-CM | POA: Diagnosis not present

## 2019-05-02 DIAGNOSIS — Z3A36 36 weeks gestation of pregnancy: Secondary | ICD-10-CM | POA: Diagnosis not present

## 2019-05-09 DIAGNOSIS — O09293 Supervision of pregnancy with other poor reproductive or obstetric history, third trimester: Secondary | ICD-10-CM | POA: Diagnosis not present

## 2019-05-09 DIAGNOSIS — Z3A37 37 weeks gestation of pregnancy: Secondary | ICD-10-CM | POA: Diagnosis not present

## 2019-05-09 DIAGNOSIS — O3663X0 Maternal care for excessive fetal growth, third trimester, not applicable or unspecified: Secondary | ICD-10-CM | POA: Diagnosis not present

## 2019-05-15 ENCOUNTER — Encounter (HOSPITAL_COMMUNITY): Payer: Self-pay | Admitting: *Deleted

## 2019-05-15 ENCOUNTER — Telehealth (HOSPITAL_COMMUNITY): Payer: Self-pay | Admitting: *Deleted

## 2019-05-15 NOTE — Telephone Encounter (Signed)
Preadmission screen  

## 2019-05-16 ENCOUNTER — Other Ambulatory Visit: Payer: Self-pay | Admitting: Obstetrics and Gynecology

## 2019-05-18 DIAGNOSIS — O3663X0 Maternal care for excessive fetal growth, third trimester, not applicable or unspecified: Secondary | ICD-10-CM | POA: Diagnosis not present

## 2019-05-18 DIAGNOSIS — Z3A39 39 weeks gestation of pregnancy: Secondary | ICD-10-CM | POA: Diagnosis not present

## 2019-05-20 ENCOUNTER — Other Ambulatory Visit (HOSPITAL_COMMUNITY)
Admission: RE | Admit: 2019-05-20 | Discharge: 2019-05-20 | Disposition: A | Discharge status: Home / Self Care | Payer: BC Managed Care – PPO | Source: Ambulatory Visit | Attending: Obstetrics and Gynecology | Admitting: Obstetrics and Gynecology

## 2019-05-20 DIAGNOSIS — Z01812 Encounter for preprocedural laboratory examination: Secondary | ICD-10-CM | POA: Insufficient documentation

## 2019-05-20 DIAGNOSIS — Z3A39 39 weeks gestation of pregnancy: Secondary | ICD-10-CM | POA: Diagnosis not present

## 2019-05-20 DIAGNOSIS — O99214 Obesity complicating childbirth: Secondary | ICD-10-CM | POA: Diagnosis not present

## 2019-05-20 DIAGNOSIS — O26893 Other specified pregnancy related conditions, third trimester: Secondary | ICD-10-CM | POA: Diagnosis not present

## 2019-05-20 DIAGNOSIS — Z20822 Contact with and (suspected) exposure to covid-19: Secondary | ICD-10-CM | POA: Diagnosis not present

## 2019-05-20 DIAGNOSIS — Z6791 Unspecified blood type, Rh negative: Secondary | ICD-10-CM | POA: Diagnosis not present

## 2019-05-20 LAB — SARS CORONAVIRUS 2 (TAT 6-24 HRS): SARS Coronavirus 2: NEGATIVE

## 2019-05-22 ENCOUNTER — Inpatient Hospital Stay (HOSPITAL_COMMUNITY): Payer: BC Managed Care – PPO | Admitting: Anesthesiology

## 2019-05-22 ENCOUNTER — Inpatient Hospital Stay (HOSPITAL_COMMUNITY): Payer: BC Managed Care – PPO

## 2019-05-22 ENCOUNTER — Encounter (HOSPITAL_COMMUNITY): Payer: Self-pay | Admitting: Obstetrics and Gynecology

## 2019-05-22 ENCOUNTER — Inpatient Hospital Stay (HOSPITAL_COMMUNITY)
Admission: RE | Admit: 2019-05-22 | Discharge: 2019-05-24 | DRG: 807 | Disposition: A | Discharge status: Home / Self Care | Payer: BC Managed Care – PPO | Attending: Obstetrics and Gynecology | Admitting: Obstetrics and Gynecology

## 2019-05-22 DIAGNOSIS — O26893 Other specified pregnancy related conditions, third trimester: Secondary | ICD-10-CM | POA: Diagnosis present

## 2019-05-22 DIAGNOSIS — Z6791 Unspecified blood type, Rh negative: Secondary | ICD-10-CM | POA: Diagnosis not present

## 2019-05-22 DIAGNOSIS — O99214 Obesity complicating childbirth: Principal | ICD-10-CM | POA: Diagnosis present

## 2019-05-22 DIAGNOSIS — Z3490 Encounter for supervision of normal pregnancy, unspecified, unspecified trimester: Secondary | ICD-10-CM | POA: Diagnosis present

## 2019-05-22 DIAGNOSIS — Z3A39 39 weeks gestation of pregnancy: Secondary | ICD-10-CM

## 2019-05-22 DIAGNOSIS — O26899 Other specified pregnancy related conditions, unspecified trimester: Secondary | ICD-10-CM

## 2019-05-22 DIAGNOSIS — Z20822 Contact with and (suspected) exposure to covid-19: Secondary | ICD-10-CM | POA: Diagnosis present

## 2019-05-22 LAB — CBC
HCT: 35.9 % — ABNORMAL LOW (ref 36.0–46.0)
Hemoglobin: 12 g/dL (ref 12.0–15.0)
MCH: 30.2 pg (ref 26.0–34.0)
MCHC: 33.4 g/dL (ref 30.0–36.0)
MCV: 90.2 fL (ref 80.0–100.0)
Platelets: 264 10*3/uL (ref 150–400)
RBC: 3.98 MIL/uL (ref 3.87–5.11)
RDW: 13.1 % (ref 11.5–15.5)
WBC: 11.4 10*3/uL — ABNORMAL HIGH (ref 4.0–10.5)
nRBC: 0 % (ref 0.0–0.2)

## 2019-05-22 LAB — TYPE AND SCREEN
ABO/RH(D): AB NEG
Antibody Screen: NEGATIVE

## 2019-05-22 LAB — ABO/RH: ABO/RH(D): AB NEG

## 2019-05-22 MED ORDER — LIDOCAINE HCL (PF) 1 % IJ SOLN: 30.0000 mL | INTRAMUSCULAR | Status: DC | PRN

## 2019-05-22 MED ORDER — LIDOCAINE HCL (PF) 1 % IJ SOLN
INTRAMUSCULAR | Status: DC | PRN
  Administered 2019-05-22: 5 mL via EPIDURAL
  Administered 2019-05-22: 4 mL via EPIDURAL

## 2019-05-22 MED ORDER — ONDANSETRON HCL 4 MG/2ML IJ SOLN: 4.0000 mg | Freq: Four times a day (QID) | INTRAMUSCULAR | Status: DC | PRN

## 2019-05-22 MED ORDER — EPHEDRINE 5 MG/ML INJ: 10.0000 mg | INTRAVENOUS | Status: DC | PRN

## 2019-05-22 MED ORDER — ACETAMINOPHEN 325 MG PO TABS: 650.0000 mg | ORAL_TABLET | ORAL | Status: DC | PRN

## 2019-05-22 MED ORDER — OXYCODONE-ACETAMINOPHEN 5-325 MG PO TABS: 2.0000 | ORAL_TABLET | ORAL | Status: DC | PRN

## 2019-05-22 MED ORDER — ONDANSETRON HCL 4 MG PO TABS: 4.0000 mg | ORAL_TABLET | ORAL | Status: DC | PRN

## 2019-05-22 MED ORDER — PHENYLEPHRINE 40 MCG/ML (10ML) SYRINGE FOR IV PUSH (FOR BLOOD PRESSURE SUPPORT): 80.0000 ug | PREFILLED_SYRINGE | INTRAVENOUS | Status: DC | PRN

## 2019-05-22 MED ORDER — ONDANSETRON HCL 4 MG/2ML IJ SOLN: 4.0000 mg | INTRAMUSCULAR | Status: DC | PRN

## 2019-05-22 MED ORDER — DIBUCAINE (PERIANAL) 1 % EX OINT: 1.0000 "application " | TOPICAL_OINTMENT | CUTANEOUS | Status: DC | PRN

## 2019-05-22 MED ORDER — OXYTOCIN 40 UNITS IN NORMAL SALINE INFUSION - SIMPLE MED
1.0000 m[IU]/min | INTRAVENOUS | Status: DC
  Administered 2019-05-22: 2 m[IU]/min via INTRAVENOUS
  Filled 2019-05-22: qty 1000

## 2019-05-22 MED ORDER — IBUPROFEN 600 MG PO TABS
600.0000 mg | ORAL_TABLET | Freq: Four times a day (QID) | ORAL | Status: DC
  Administered 2019-05-22 – 2019-05-24 (×8): 600 mg via ORAL
  Filled 2019-05-22 (×8): qty 1

## 2019-05-22 MED ORDER — METHYLERGONOVINE MALEATE 0.2 MG PO TABS: 0.2000 mg | ORAL_TABLET | ORAL | Status: DC | PRN

## 2019-05-22 MED ORDER — LACTATED RINGERS IV SOLN: 500.0000 mL | INTRAVENOUS | Status: DC | PRN

## 2019-05-22 MED ORDER — SOD CITRATE-CITRIC ACID 500-334 MG/5ML PO SOLN: 30.0000 mL | ORAL | Status: DC | PRN

## 2019-05-22 MED ORDER — SODIUM CHLORIDE 0.9 % IV SOLN
5.0000 10*6.[IU] | Freq: Once | INTRAVENOUS | Status: AC
  Administered 2019-05-22: 5 10*6.[IU] via INTRAVENOUS
  Filled 2019-05-22: qty 5

## 2019-05-22 MED ORDER — PENICILLIN G POT IN DEXTROSE 60000 UNIT/ML IV SOLN
3.0000 10*6.[IU] | INTRAVENOUS | Status: DC
  Administered 2019-05-22: 3 10*6.[IU] via INTRAVENOUS
  Filled 2019-05-22: qty 50

## 2019-05-22 MED ORDER — SIMETHICONE 80 MG PO CHEW: 80.0000 mg | CHEWABLE_TABLET | ORAL | Status: DC | PRN

## 2019-05-22 MED ORDER — LACTATED RINGERS IV SOLN: INTRAVENOUS | Status: DC

## 2019-05-22 MED ORDER — BENZOCAINE-MENTHOL 20-0.5 % EX AERO
1.0000 "application " | INHALATION_SPRAY | CUTANEOUS | Status: DC | PRN
  Administered 2019-05-23: 1 via TOPICAL
  Filled 2019-05-22 (×2): qty 56

## 2019-05-22 MED ORDER — OXYTOCIN BOLUS FROM INFUSION
500.0000 mL | Freq: Once | INTRAVENOUS | Status: AC
  Administered 2019-05-22: 500 mL via INTRAVENOUS

## 2019-05-22 MED ORDER — LACTATED RINGERS IV SOLN: 500.0000 mL | Freq: Once | INTRAVENOUS | Status: DC

## 2019-05-22 MED ORDER — DIPHENHYDRAMINE HCL 25 MG PO CAPS: 25.0000 mg | ORAL_CAPSULE | Freq: Four times a day (QID) | ORAL | Status: DC | PRN

## 2019-05-22 MED ORDER — ZOLPIDEM TARTRATE 5 MG PO TABS: 5.0000 mg | ORAL_TABLET | Freq: Every evening | ORAL | Status: DC | PRN

## 2019-05-22 MED ORDER — PRENATAL MULTIVITAMIN CH
1.0000 | ORAL_TABLET | Freq: Every day | ORAL | Status: DC
  Administered 2019-05-23 – 2019-05-24 (×2): 1 via ORAL
  Filled 2019-05-22 (×2): qty 1

## 2019-05-22 MED ORDER — FENTANYL-BUPIVACAINE-NACL 0.5-0.125-0.9 MG/250ML-% EP SOLN
12.0000 mL/h | EPIDURAL | Status: DC | PRN
  Filled 2019-05-22: qty 250

## 2019-05-22 MED ORDER — OXYCODONE-ACETAMINOPHEN 5-325 MG PO TABS: 1.0000 | ORAL_TABLET | ORAL | Status: DC | PRN

## 2019-05-22 MED ORDER — WITCH HAZEL-GLYCERIN EX PADS: 1.0000 "application " | MEDICATED_PAD | CUTANEOUS | Status: DC | PRN

## 2019-05-22 MED ORDER — TETANUS-DIPHTH-ACELL PERTUSSIS 5-2.5-18.5 LF-MCG/0.5 IM SUSP: 0.5000 mL | Freq: Once | INTRAMUSCULAR | Status: DC

## 2019-05-22 MED ORDER — COCONUT OIL OIL: 1.0000 "application " | TOPICAL_OIL | Status: DC | PRN

## 2019-05-22 MED ORDER — OXYTOCIN 40 UNITS IN NORMAL SALINE INFUSION - SIMPLE MED: 2.5000 [IU]/h | INTRAVENOUS | Status: DC

## 2019-05-22 MED ORDER — SODIUM CHLORIDE (PF) 0.9 % IJ SOLN
INTRAMUSCULAR | Status: DC | PRN
  Administered 2019-05-22: 12 mL/h via EPIDURAL

## 2019-05-22 MED ORDER — METHYLERGONOVINE MALEATE 0.2 MG/ML IJ SOLN: 0.2000 mg | INTRAMUSCULAR | Status: DC | PRN

## 2019-05-22 MED ORDER — SENNOSIDES-DOCUSATE SODIUM 8.6-50 MG PO TABS
2.0000 | ORAL_TABLET | ORAL | Status: DC
  Administered 2019-05-22 – 2019-05-23 (×2): 2 via ORAL
  Filled 2019-05-22 (×2): qty 2

## 2019-05-22 MED ORDER — TERBUTALINE SULFATE 1 MG/ML IJ SOLN: 0.2500 mg | Freq: Once | INTRAMUSCULAR | Status: DC | PRN

## 2019-05-22 MED ORDER — DIPHENHYDRAMINE HCL 50 MG/ML IJ SOLN: 12.5000 mg | INTRAMUSCULAR | Status: DC | PRN

## 2019-05-22 NOTE — Anesthesia Procedure Notes (Signed)
Epidural Patient location during procedure: OB Start time: 05/22/2019 12:55 PM End time: 05/22/2019 12:59 PM  Staffing Anesthesiologist: Beryle Lathe, MD Performed: anesthesiologist   Preanesthetic Checklist Completed: patient identified, IV checked, risks and benefits discussed, monitors and equipment checked, pre-op evaluation and timeout performed  Epidural Patient position: sitting Prep: DuraPrep Patient monitoring: continuous pulse ox and blood pressure Approach: midline Location: L2-L3 Injection technique: LOR saline  Needle:  Needle type: Tuohy  Needle gauge: 17 G Needle length: 9 cm Needle insertion depth: 6 cm Catheter size: 19 Gauge Catheter at skin depth: 11 cm Test dose: negative and Other (1% lidocaine)  Assessment Events: blood not aspirated  Additional Notes Patient identified. Risks including, but not limited to, bleeding, infection, nerve damage, paralysis, inadequate analgesia, blood pressure changes, nausea, vomiting, allergic reaction, postpartum back pain, itching, and headache were discussed. Patient expressed understanding and wished to proceed. Sterile prep and drape, including hand hygiene, mask, and sterile gloves were used. The patient was positioned and the spine was prepped. The skin was anesthetized with lidocaine. No paraesthesia or other complication noted. The patient did not experience any signs of intravascular injection such as tinnitus or metallic taste in mouth, nor signs of intrathecal spread such as rapid motor block. Please see nursing notes for vital signs. The patient tolerated the procedure well.   Leslye Peer, MDReason for block:procedure for pain

## 2019-05-22 NOTE — H&P (Signed)
Meghan Mason is a 34 y.o. female presenting for IOL for AMA and Recurrent pregnancy  loss. OB History    Gravida  5   Para  1   Term  1   Preterm      AB  3   Living  1     SAB  3   TAB      Ectopic      Multiple      Live Births  1          Past Medical History:  Diagnosis Date  . Homozygous MTHFR mutation C677T (HCC)   . Medical history non-contributory   . PONV (postoperative nausea and vomiting)   . Vaginal Pap smear, abnormal    Past Surgical History:  Procedure Laterality Date  . AUGMENTATION MAMMAPLASTY Bilateral 2009   Family History: family history includes Breast cancer (age of onset: 67) in her maternal aunt; Hypertension in her mother; Thyroid disease in her mother. Social History:  reports that she has never smoked. She has never used smokeless tobacco. She reports previous alcohol use. She reports that she does not use drugs.     Maternal Diabetes: No Genetic Screening: Normal Maternal Ultrasounds/Referrals: Normal Fetal Ultrasounds or other Referrals:  None Maternal Substance Abuse:  No Significant Maternal Medications:  None Significant Maternal Lab Results:  Group B Strep negative Other Comments:  None  Review of Systems  Constitutional: Negative.   All other systems reviewed and are negative.  Maternal Medical History:  Contractions: Perceived severity is mild.    Fetal activity: Perceived fetal activity is normal.    Prenatal complications: no prenatal complications Prenatal Complications - Diabetes: none.    Dilation: 1 Effacement (%): 60 Station: -3 Exam by:: Terri Skains, rn Blood pressure 118/78, pulse 94, temperature 97.7 F (36.5 C), temperature source Oral, resp. rate 16, height 5\' 11"  (1.803 m), weight 108 kg, last menstrual period 08/18/2018. Maternal Exam:  Uterine Assessment: Contraction strength is mild.  Contraction frequency is irregular.   Abdomen: Patient reports no abdominal tenderness. Fetal  presentation: vertex  Introitus: Normal vulva. Normal vagina.  Ferning test: not done.  Nitrazine test: not done. Amniotic fluid character: not assessed.  Pelvis: adequate for delivery.   Cervix: Cervix evaluated by digital exam.     Physical Exam  Nursing note and vitals reviewed. Constitutional: She is oriented to person, place, and time. She appears well-developed and well-nourished.  HENT:  Head: Normocephalic and atraumatic.  Cardiovascular: Normal rate and regular rhythm.  Respiratory: Effort normal and breath sounds normal.  GI: Soft. Bowel sounds are normal.  Genitourinary:    Vulva, vagina and uterus normal.   Musculoskeletal:        General: Normal range of motion.     Cervical back: Normal range of motion and neck supple.  Neurological: She is alert and oriented to person, place, and time. She has normal reflexes.  Skin: Skin is warm and dry.  Psychiatric: She has a normal mood and affect.    Prenatal labs: ABO, Rh: --/--/AB NEG (05/10 1008) Antibody: POS (05/10 1008) Rubella: Immune (10/09 0000) RPR: Nonreactive (10/09 0000)  HBsAg: Negative (10/09 0000)  HIV: Non-reactive (10/09 0000)  GBS: Positive/-- (04/12 0000)   Assessment/Plan: 39wk IUP RPL with poor OB history AMA IOL   Paulanthony Gleaves J 05/22/2019, 12:03 PM

## 2019-05-22 NOTE — Anesthesia Preprocedure Evaluation (Signed)
Anesthesia Evaluation  Patient identified by MRN, date of birth, ID band Patient awake    Reviewed: Allergy & Precautions, NPO status , Patient's Chart, lab work & pertinent test results  History of Anesthesia Complications (+) PONV and history of anesthetic complications  Airway Mallampati: II   Neck ROM: Full    Dental   Pulmonary neg pulmonary ROS,    Pulmonary exam normal        Cardiovascular negative cardio ROS Normal cardiovascular exam     Neuro/Psych negative neurological ROS  negative psych ROS   GI/Hepatic negative GI ROS, Neg liver ROS,   Endo/Other   Obesity   Renal/GU negative Renal ROS     Musculoskeletal negative musculoskeletal ROS (+)   Abdominal   Peds  Hematology negative hematology ROS (+)  Plt 264k    Anesthesia Other Findings Homozygous MTHFR mutation  Covid neg 5/8  Reproductive/Obstetrics (+) Pregnancy                             Anesthesia Physical Anesthesia Plan  ASA: II  Anesthesia Plan: Epidural   Post-op Pain Management:    Induction:   PONV Risk Score and Plan: 3 and Treatment may vary due to age or medical condition  Airway Management Planned: Natural Airway  Additional Equipment: None  Intra-op Plan:   Post-operative Plan:   Informed Consent: I have reviewed the patients History and Physical, chart, labs and discussed the procedure including the risks, benefits and alternatives for the proposed anesthesia with the patient or authorized representative who has indicated his/her understanding and acceptance.       Plan Discussed with: Anesthesiologist  Anesthesia Plan Comments: (Labs reviewed. Platelets acceptable, patient not taking any blood thinning medications. Per RN, FHR tracing reported to be stable enough for sitting procedure. Risks and benefits discussed with patient, including PDPH, backache, epidural hematoma, failed  epidural, blood pressure changes, allergic reaction, and nerve injury. Patient expressed understanding and wished to proceed.)        Anesthesia Quick Evaluation

## 2019-05-23 DIAGNOSIS — O26899 Other specified pregnancy related conditions, unspecified trimester: Secondary | ICD-10-CM

## 2019-05-23 DIAGNOSIS — Z6791 Unspecified blood type, Rh negative: Secondary | ICD-10-CM

## 2019-05-23 LAB — CBC
HCT: 32.4 % — ABNORMAL LOW (ref 36.0–46.0)
Hemoglobin: 10.5 g/dL — ABNORMAL LOW (ref 12.0–15.0)
MCH: 29.6 pg (ref 26.0–34.0)
MCHC: 32.4 g/dL (ref 30.0–36.0)
MCV: 91.3 fL (ref 80.0–100.0)
Platelets: 210 10*3/uL (ref 150–400)
RBC: 3.55 MIL/uL — ABNORMAL LOW (ref 3.87–5.11)
RDW: 13.2 % (ref 11.5–15.5)
WBC: 11.8 10*3/uL — ABNORMAL HIGH (ref 4.0–10.5)
nRBC: 0 % (ref 0.0–0.2)

## 2019-05-23 LAB — RPR: RPR Ser Ql: NONREACTIVE

## 2019-05-23 MED ORDER — IBUPROFEN 600 MG PO TABS: 600.0000 mg | ORAL_TABLET | Freq: Four times a day (QID) | ORAL | 0 refills | Status: AC

## 2019-05-23 MED ORDER — COCONUT OIL OIL: 1.0000 "application " | TOPICAL_OIL | 0 refills | Status: AC | PRN

## 2019-05-23 MED ORDER — ACETAMINOPHEN 500 MG PO TABS: 1000.0000 mg | ORAL_TABLET | Freq: Four times a day (QID) | ORAL | 2 refills | Status: AC | PRN

## 2019-05-23 MED ORDER — BENZOCAINE-MENTHOL 20-0.5 % EX AERO: 1.0000 "application " | INHALATION_SPRAY | CUTANEOUS | Status: AC | PRN

## 2019-05-23 MED ORDER — RHO D IMMUNE GLOBULIN 1500 UNIT/2ML IJ SOSY
300.0000 ug | PREFILLED_SYRINGE | Freq: Once | INTRAMUSCULAR | Status: AC
  Administered 2019-05-23: 11:00:00 300 ug via INTRAVENOUS
  Filled 2019-05-23: qty 2

## 2019-05-23 NOTE — Anesthesia Postprocedure Evaluation (Signed)
Anesthesia Post Note  Patient: Meghan Mason  Procedure(s) Performed: AN AD HOC LABOR EPIDURAL     Patient location during evaluation: Mother Baby Anesthesia Type: Epidural Level of consciousness: awake and alert Pain management: pain level controlled Vital Signs Assessment: post-procedure vital signs reviewed and stable Respiratory status: spontaneous breathing, nonlabored ventilation and respiratory function stable Cardiovascular status: stable Postop Assessment: no headache, no backache, epidural receding, no apparent nausea or vomiting, patient able to bend at knees, adequate PO intake and able to ambulate Anesthetic complications: no    Last Vitals:  Vitals:   05/23/19 0330 05/23/19 0730  BP: 116/79 111/77  Pulse: 68 74  Resp: 16 18  Temp: 36.6 C 36.6 C  SpO2:      Last Pain:  Vitals:   05/23/19 1000  TempSrc:   PainSc: 0-No pain   Pain Goal:                   [place]

## 2019-05-23 NOTE — Lactation Note (Signed)
This note was copied from a baby's chart. Lactation Consultation Note  Patient Name: Meghan Mason IDHWY'S Date: 05/23/2019    Mother is a P2, infant is 36 hours old. Mother reports that she attempt for 7 days to breastfeed her first child. She reports that she has so much anxiety that her OB recommended that she bottle feed. She also reports that her nipples were very damaged and sore.  She reports that this was a very bad experience. This was 5 yrs ago.   Mother was given [facility] brochure and basic teaching done.  Infant was just circumcised. He is sleeping in crib.  Staff nurse at the bedside . She recommended hand expressing in a colostrum vial and  Reviewed hand expression with mother.observed collecting large drops of colostrum in colostrum vial.. Mother was given a harmony hand pump with instructions. Mothers nipples are erect with compressible breast tissue Mothers nipples are very pink and tender. Comfort gels and coconut oil given. Mother wearing comfort gels..  Plan of Care : Breastfeed infant with feeding cues   Mother to continue to cue base feed infant and feed at least 8-12 times or more in 24 hours and advised to allow for cluster feeding infant as needed.    Mother to continue to due STS. Mother is aware of available LC services at [facility], BFSG'S, OP Dept, and phone # for questions or concerns about breastfeeding.  Mother receptive to all teaching and plan of care.     Maternal Data    Feeding Feeding Type: Breast Fed  LATCH Score Latch: Repeated attempts needed to sustain latch, nipple held in mouth throughout feeding, stimulation needed to elicit sucking reflex.  Audible Swallowing: Spontaneous and intermittent  Type of Nipple: Everted at rest and after stimulation  Comfort (Breast/Nipple): Soft / non-tender  Hold (Positioning): Assistance needed to correctly position infant at breast and maintain latch.  LATCH Score: 8  Interventions Interventions:  Breast feeding basics reviewed;Assisted with latch;Skin to skin;Hand express;Breast compression;Adjust position;Support pillows;Position options  Lactation Tools Discussed/Used     Consult Status      [name] 05/23/2019, 3:51 PM

## 2019-05-23 NOTE — Discharge Summary (Addendum)
OB Discharge Summary  Patient Name: Meghan Mason DOB: 1985-06-22 MRN: [id number]  Date of admission: 05/22/2019 Delivering provider: Brien Few   Admitting diagnosis: Encounter for induction of labor [Z34.90] Intrauterine pregnancy: [redacted]w[redacted]d     Secondary diagnosis: Patient Active Problem List   Diagnosis Date Noted  . Rh negative, maternal / newborn Rh pos 05/23/2019  . Encounter for induction of labor 05/22/2019  . SVD (spontaneous vaginal delivery) 05/22/2019  . Postpartum care following vaginal delivery (5/10) 05/22/2019  . Second degree perineal laceration 05/22/2019   Additional problems:none   Date of discharge: 05/24/2019   Discharge diagnosis: Principal Problem:   Postpartum care following vaginal delivery (5/10) Active Problems:   Encounter for induction of labor   SVD (spontaneous vaginal delivery)   Second degree perineal laceration   Rh negative, maternal / newborn Rh pos                                                              Post partum procedures:rhogam  Augmentation: AROM and Pitocin Pain control: Epidural  Laceration:2nd degree;Vaginal  Episiotomy:None  Complications: None  Hospital course:  Induction of Labor With Vaginal Delivery   34 y.o. yo [id number] at [redacted]w[redacted]d was admitted to the hospital 05/22/2019 for induction of labor.  Indication for induction: Favorable cervix at term and AMA.  Patient had an uncomplicated labor course as follows: Membrane Rupture Time/Date: 12:32 PM ,05/22/2019   Delivery Method:Vaginal, Spontaneous  Episiotomy: None  Lacerations:  2nd degree;Vaginal  Details of delivery can be found in separate delivery note.  Patient had a routine postpartum course. Patient is discharged home 05/23/19.  Newborn Data: Birth date:05/22/2019  Birth time:5:00 PM  Gender:Female  Living status:Living  Apgars:8 ,9   Physical exam  Vitals:   05/22/19 1933 05/22/19 2325 05/23/19 0330 05/23/19 0730  BP: 122/77 119/69 116/79 111/77  Pulse: 97  92 68 74  Resp: 16 16 16 18   Temp: 98.5 F (36.9 C) 97.7 F (36.5 C) 97.8 F (36.6 C) 97.9 F (36.6 C)  TempSrc: Oral Oral Oral Oral  SpO2:  100%    Weight:      Height:       General: alert, cooperative and no distress Lochia: appropriate Uterine Fundus: firm Incision: N/A DVT Evaluation: No cords or calf tenderness. No significant calf/ankle edema. Labs: Lab Results  Component Value Date   WBC 11.8 (H) 05/23/2019   HGB 10.5 (L) 05/23/2019   HCT 32.4 (L) 05/23/2019   MCV 91.3 05/23/2019   PLT 210 05/23/2019   No flowsheet data found. Edinburgh Postnatal Depression Scale Screening Tool 05/23/2019 05/22/2019  I have been able to laugh and see the funny side of things. 0 (No Data)  I have looked forward with enjoyment to things. 0 -  I have blamed myself unnecessarily when things went wrong. 0 -  I have been anxious or worried for no good reason. 1 -  I have felt scared or panicky for no good reason. 0 -  Things have been getting on top of me. 0 -  I have been so unhappy that I have had difficulty sleeping. 0 -  I have felt sad or miserable. 0 -  I have been so unhappy that I have been crying. 0 -  The thought of harming  myself has occurred to me. 0 -  Edinburgh Postnatal Depression Scale Total 1 -   Vaccines: TDaP UTD         Flu    UTD  Discharge instruction:  per After Visit Summary,  [facility] booklet and  "Understanding Mother & Baby Care" hospital booklet  After Visit Meds:  Allergies as of 05/23/2019      Reactions   Sulfamethoxazole-trimethoprim Nausea And Vomiting      Medication List    STOP taking these medications   aspirin EC 81 MG tablet     TAKE these medications   acetaminophen 500 MG tablet Commonly known as: TYLENOL Take 2 tablets (1,000 mg total) by mouth every 6 (six) hours as needed.   benzocaine-Menthol 20-0.5 % Aero Commonly known as: DERMOPLAST Apply 1 application topically as needed for irritation (perineal discomfort).    coconut oil Oil Apply 1 application topically as needed.   DHA 200 MG Caps Take 200 mg by mouth daily.   ibuprofen 600 MG tablet Commonly known as: ADVIL Take 1 tablet (600 mg total) by mouth every 6 (six) hours.   prenatal multivitamin Tabs tablet Take 1 tablet by mouth daily at 12 noon.            Discharge Care Instructions  (From admission, onward)         Start     Ordered   05/23/19 0000  Discharge wound care:    Comments: Sitz baths 2 times /day with warm water x 1 week. May add herbals: 1 ounce dried comfrey leaf* 1 ounce calendula flowers 1 ounce lavender flowers 1/2 ounce dried uva ursi leaves 1/2 ounce witch hazel blossoms (if you can find them) 1/2 ounce dried sage leaf 1/2 cup sea salt Directions: Bring 2 quarts of water to a boil. Turn off heat, and place 1 ounce (approximately 1 large handful) of the above mixed herbs (not the salt) into the pot. Steep, covered, for 30 minutes.  Strain the liquid well with a fine mesh strainer, and discard the herb material. Add 2 quarts of liquid to the tub, along with the 1/2 cup of salt. This medicinal liquid can also be made into compresses and peri-rinses.   05/23/19 1130          Diet: routine diet  Activity: Advance as tolerated. Pelvic rest for 6 weeks.   Postpartum contraception: Not Discussed  Newborn Data: Live born female  Birth Weight: 8 lb 4.1 oz (3745 g) APGAR: 8, 9  Newborn Delivery   Birth date/time: 05/22/2019 17:00:00 Delivery type: Vaginal, Spontaneous      named Meghan Mason Baby Feeding: Breast Disposition:home with mother   Delivery Report:  Review the Delivery Report for details.    Follow up: Follow-up Information    Olivia Mackie, MD. Schedule an appointment as soon as possible for a visit in 6 week(s).   Specialty: Obstetrics and Gynecology Contact information: [address] [place] [address] [phone]             Signed: Cipriano Mile,  MSN 05/23/2019, 11:30 AM   05/24/2019 10:35 AM   Patient remained inpatient additional night d/t newborn being kept for observation (needed BM).  Patient discharged home today in stable condition, status as above,   Neta Mends, MSN, CNM 05/24/2019, 10:36 AM

## 2019-05-23 NOTE — Lactation Note (Addendum)
This note was copied from a baby's chart. Lactation Consultation Note Baby 19 hrs old. Starting to cluster feed.  Feeding for 30-40 minutes a feeding. Mom states nipples are sore. Mom likes football position. Has breast friend pillow. Gave mom another pillow to place on top or under breast friend pillow to get baby closer to mom. Discussed support, positions, and comfort.  Newborn feeding habits, STS, I&O, breast massage, milk storage, supply and demand reviewed. Gave mom shells to wear to evert nipple more and keep clothing from rubbing sore nipples. Short shaft nipples evert more w/stimulation. Nipples intact, pink and sore. Mom has coconut oil and comfort gels for sore nipples. Encouraged cheeks to breast to obtain deep latch and chin tug if needed. Baby hasn't had stool since [place]. Stain delivery. Has had a lot of voids. Encouraged to call for assistance if needed.  Patient Name: Meghan Mason UUVOZ'D Date: 05/23/2019 Reason for consult: Follow-up assessment;Term   Maternal Data    Feeding    LATCH Score       Type of Nipple: Everted at rest and after stimulation(short shaft)  Comfort (Breast/Nipple): Filling, red/small blisters or bruises, mild/mod discomfort(pink and tender)        Interventions Interventions: Breast feeding basics reviewed;Support pillows;Position options;Breast massage;Hand express;Pre-pump if needed;Shells;Coconut oil;Breast compression  Lactation Tools Discussed/Used Tools: Shells;Coconut oil Shell Type: Inverted WIC Program: No   Consult Status Consult Status: Follow-up Date: 05/24/19 Follow-up type: In-patient    Meghan Mason 05/23/2019, 10:36 PM

## 2019-05-24 LAB — RH IG WORKUP (INCLUDES ABO/RH)
ABO/RH(D): AB NEG
Fetal Screen: NEGATIVE
Gestational Age(Wks): 39.4
Unit division: 0

## 2019-05-24 NOTE — Lactation Note (Signed)
This note was copied from a baby's chart. Lactation Consultation Note  Patient Name: Meghan Mason YBWLS'L Date: 05/24/2019 Reason for consult: Follow-up assessment;Breast augmentation;Term Baby is 40 hours old and 6% weight loss.  Mom states baby cluster fed last night.  Baby had meconium stained fluid but no stool since birth. Voids WNL.  Mom states she likes using football hold.  Encouraged to feed skin to skin and use good breast massage and compression during feeding.  Discussed milk coming to volume and the prevention and treatment of engorgement.  Mom has a DEBP at home.  Reviewed outpatient services and encouraged to call prn.  Maternal Data    Feeding    LATCH Score                   Interventions    Lactation Tools Discussed/Used     Consult Status Consult Status: Complete Follow-up type: Call as needed    Huston Foley 05/24/2019, 9:23 AM

## 2019-05-27 ENCOUNTER — Ambulatory Visit: Payer: BC Managed Care – PPO | Attending: Internal Medicine

## 2019-05-27 ENCOUNTER — Other Ambulatory Visit: Payer: Self-pay

## 2019-05-27 DIAGNOSIS — Z23 Encounter for immunization: Secondary | ICD-10-CM

## 2019-05-27 NOTE — Progress Notes (Signed)
   Covid-19 Vaccination Clinic  Name:  Meghan Mason    MRN: [id number] DOB: 21-Jan-1985  05/27/2019  [name] was observed post Covid-19 immunization for 15 minutes without incident. She was provided with Vaccine Information Sheet and instruction to access the V-Safe system.   [name] was instructed to call 911 with any severe reactions post vaccine: Marland Kitchen Difficulty breathing  . Swelling of face and throat  . A fast heartbeat  . A bad rash all over body  . Dizziness and weakness   Immunizations Administered    Name Date Dose VIS Date Route   Pfizer COVID-19 Vaccine 05/27/2019  8:40 AM 0.3 mL 03/08/2018 Intramuscular   Manufacturer: [employer], [employer]   Lot: [id number]   NDC: [address]-5

## 2019-06-19 ENCOUNTER — Ambulatory Visit: Payer: BC Managed Care – PPO | Attending: Internal Medicine

## 2019-06-19 DIAGNOSIS — Z23 Encounter for immunization: Secondary | ICD-10-CM

## 2019-06-19 NOTE — Progress Notes (Signed)
   Covid-19 Vaccination Clinic  Name:  Shamere Dilworth    MRN: [id number] DOB: Mar 14, 1985  06/19/2019  Ms. Medico was observed post Covid-19 immunization for 15 minutes without incident. She was provided with Vaccine Information Sheet and instruction to access the V-Safe system.   Ms. Schlechter was instructed to call 911 with any severe reactions post vaccine: Marland Kitchen Difficulty breathing  . Swelling of face and throat  . A fast heartbeat  . A bad rash all over body  . Dizziness and weakness   Immunizations Administered    Name Date Dose VIS Date Route   Pfizer COVID-19 Vaccine 06/19/2019  8:34 AM 0.3 mL 03/08/2018 Intramuscular   Manufacturer: [employer], [employer]   Lot: [id number]   NDC: [address]-5

## 2019-07-03 DIAGNOSIS — Z1151 Encounter for screening for human papillomavirus (HPV): Secondary | ICD-10-CM | POA: Diagnosis not present

## 2019-07-03 DIAGNOSIS — Z124 Encounter for screening for malignant neoplasm of cervix: Secondary | ICD-10-CM | POA: Diagnosis not present

## 2019-08-17 DIAGNOSIS — L509 Urticaria, unspecified: Secondary | ICD-10-CM | POA: Diagnosis not present

## 2019-08-17 DIAGNOSIS — F419 Anxiety disorder, unspecified: Secondary | ICD-10-CM | POA: Diagnosis not present

## 2019-08-17 DIAGNOSIS — Z1589 Genetic susceptibility to other disease: Secondary | ICD-10-CM | POA: Diagnosis not present

## 2019-10-06 DIAGNOSIS — N6311 Unspecified lump in the right breast, upper outer quadrant: Secondary | ICD-10-CM | POA: Diagnosis not present

## 2019-10-09 ENCOUNTER — Other Ambulatory Visit: Payer: Self-pay | Admitting: Obstetrics and Gynecology

## 2019-10-09 DIAGNOSIS — N63 Unspecified lump in unspecified breast: Secondary | ICD-10-CM

## 2019-10-11 ENCOUNTER — Ambulatory Visit
Admission: RE | Admit: 2019-10-11 | Discharge: 2019-10-11 | Disposition: A | Discharge status: Home / Self Care | Payer: BC Managed Care – PPO | Source: Ambulatory Visit | Attending: Obstetrics and Gynecology | Admitting: Obstetrics and Gynecology

## 2019-10-11 ENCOUNTER — Other Ambulatory Visit: Payer: Self-pay

## 2019-10-11 DIAGNOSIS — N6489 Other specified disorders of breast: Secondary | ICD-10-CM | POA: Diagnosis not present

## 2019-10-11 DIAGNOSIS — N63 Unspecified lump in unspecified breast: Secondary | ICD-10-CM

## 2019-10-11 DIAGNOSIS — R928 Other abnormal and inconclusive findings on diagnostic imaging of breast: Secondary | ICD-10-CM | POA: Diagnosis not present

## 2019-10-20 ENCOUNTER — Other Ambulatory Visit: Payer: BC Managed Care – PPO

## 2019-12-28 DIAGNOSIS — Z20822 Contact with and (suspected) exposure to covid-19: Secondary | ICD-10-CM | POA: Diagnosis not present

## 2019-12-28 DIAGNOSIS — J029 Acute pharyngitis, unspecified: Secondary | ICD-10-CM | POA: Diagnosis not present

## 2019-12-28 DIAGNOSIS — Z03818 Encounter for observation for suspected exposure to other biological agents ruled out: Secondary | ICD-10-CM | POA: Diagnosis not present

## 2019-12-28 DIAGNOSIS — J0110 Acute frontal sinusitis, unspecified: Secondary | ICD-10-CM | POA: Diagnosis not present

## 2020-01-01 DIAGNOSIS — M9903 Segmental and somatic dysfunction of lumbar region: Secondary | ICD-10-CM | POA: Diagnosis not present

## 2020-01-01 DIAGNOSIS — M9901 Segmental and somatic dysfunction of cervical region: Secondary | ICD-10-CM | POA: Diagnosis not present

## 2020-01-01 DIAGNOSIS — M9905 Segmental and somatic dysfunction of pelvic region: Secondary | ICD-10-CM | POA: Diagnosis not present

## 2020-01-01 DIAGNOSIS — M9902 Segmental and somatic dysfunction of thoracic region: Secondary | ICD-10-CM | POA: Diagnosis not present

## 2020-01-01 DIAGNOSIS — Z23 Encounter for immunization: Secondary | ICD-10-CM | POA: Diagnosis not present

## 2020-01-26 DIAGNOSIS — J351 Hypertrophy of tonsils: Secondary | ICD-10-CM | POA: Diagnosis not present

## 2020-02-13 DIAGNOSIS — C4441 Basal cell carcinoma of skin of scalp and neck: Secondary | ICD-10-CM | POA: Diagnosis not present

## 2020-02-13 DIAGNOSIS — D225 Melanocytic nevi of trunk: Secondary | ICD-10-CM | POA: Diagnosis not present

## 2020-02-13 DIAGNOSIS — B078 Other viral warts: Secondary | ICD-10-CM | POA: Diagnosis not present

## 2020-02-13 DIAGNOSIS — D2261 Melanocytic nevi of right upper limb, including shoulder: Secondary | ICD-10-CM | POA: Diagnosis not present

## 2020-02-13 DIAGNOSIS — Z85828 Personal history of other malignant neoplasm of skin: Secondary | ICD-10-CM | POA: Diagnosis not present

## 2020-02-13 DIAGNOSIS — D2262 Melanocytic nevi of left upper limb, including shoulder: Secondary | ICD-10-CM | POA: Diagnosis not present

## 2020-02-23 DIAGNOSIS — R221 Localized swelling, mass and lump, neck: Secondary | ICD-10-CM | POA: Diagnosis not present

## 2020-03-07 DIAGNOSIS — C4441 Basal cell carcinoma of skin of scalp and neck: Secondary | ICD-10-CM | POA: Diagnosis not present

## 2020-04-01 DIAGNOSIS — J0100 Acute maxillary sinusitis, unspecified: Secondary | ICD-10-CM | POA: Diagnosis not present

## 2020-04-13 DIAGNOSIS — R059 Cough, unspecified: Secondary | ICD-10-CM | POA: Diagnosis not present

## 2020-04-13 DIAGNOSIS — J0100 Acute maxillary sinusitis, unspecified: Secondary | ICD-10-CM | POA: Diagnosis not present

## 2020-07-09 DIAGNOSIS — Z01419 Encounter for gynecological examination (general) (routine) without abnormal findings: Secondary | ICD-10-CM | POA: Diagnosis not present

## 2020-07-09 DIAGNOSIS — Z6831 Body mass index (BMI) 31.0-31.9, adult: Secondary | ICD-10-CM | POA: Diagnosis not present

## 2020-07-09 DIAGNOSIS — Z124 Encounter for screening for malignant neoplasm of cervix: Secondary | ICD-10-CM | POA: Diagnosis not present

## 2020-11-14 IMAGING — MG MM  DIGITAL DIAGNOSTIC BREAST BILAT IMPLANT W/ TOMO W/ CAD
8 of 14 series · 8 of 34 positions shown · non-contrast
Comparison: Prior mammography and RIGHT breast ultrasound
09/08/2018.

CLINICAL DATA: 34-year-old with indwelling retropectoral saline
implants, presenting with a possible palpable lump in the UPPER
OUTER periareolar RIGHT breast. Annual evaluation, LEFT breast.

Family history of breast cancer in a maternal aunt who died from her
breast cancer.
EXAM:
DIGITAL DIAGNOSTIC BILATERAL MAMMOGRAM WITH IMPLANTS, CAD AND TOMO
ULTRASOUND RIGHT BREAST

[R MLO]
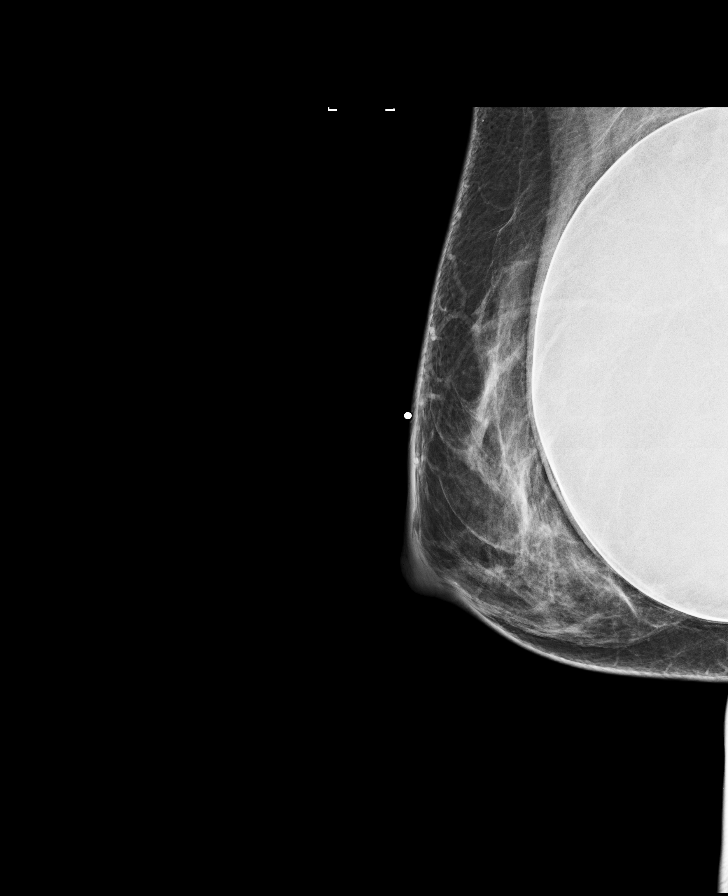

[L CC]
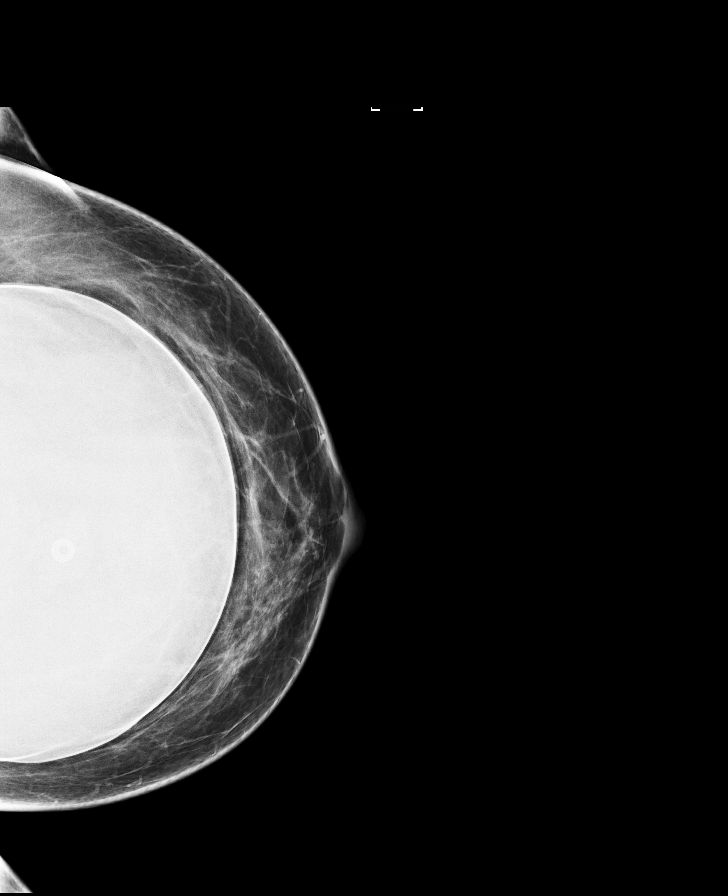

[L MLO]
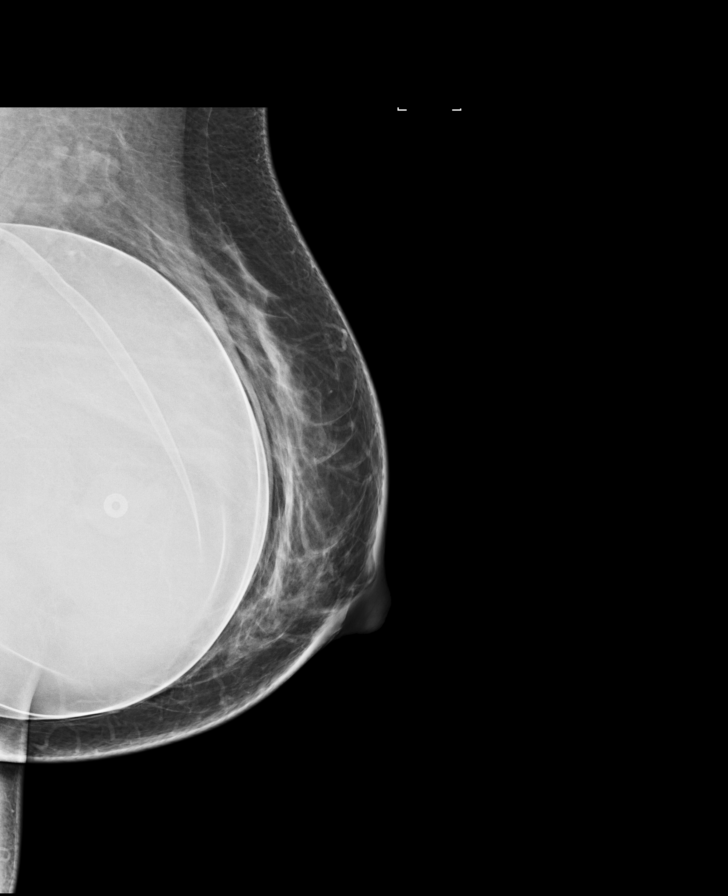

[R CC]
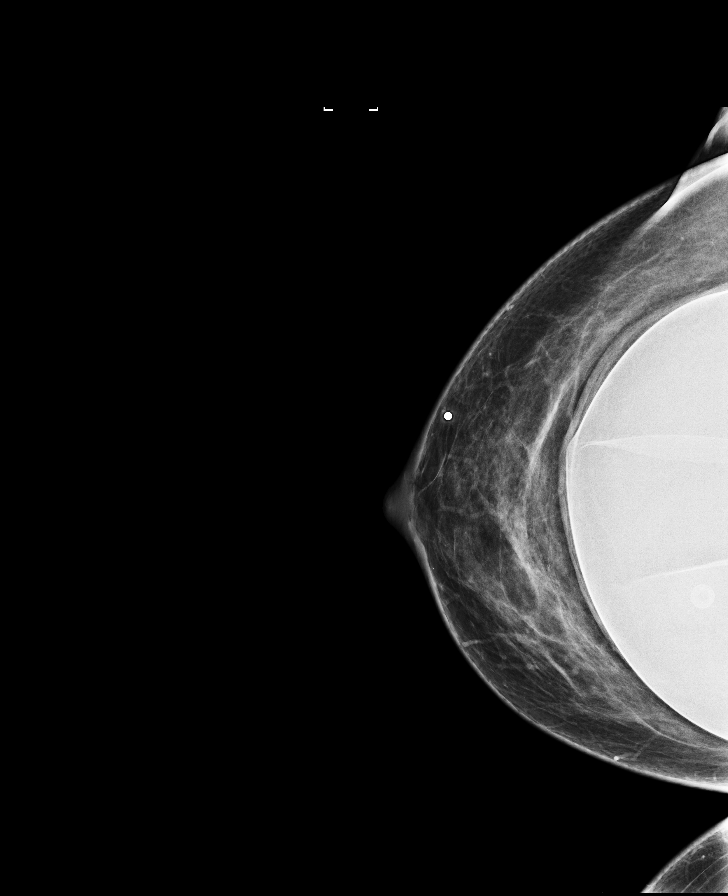

[R MLO synth-2D]
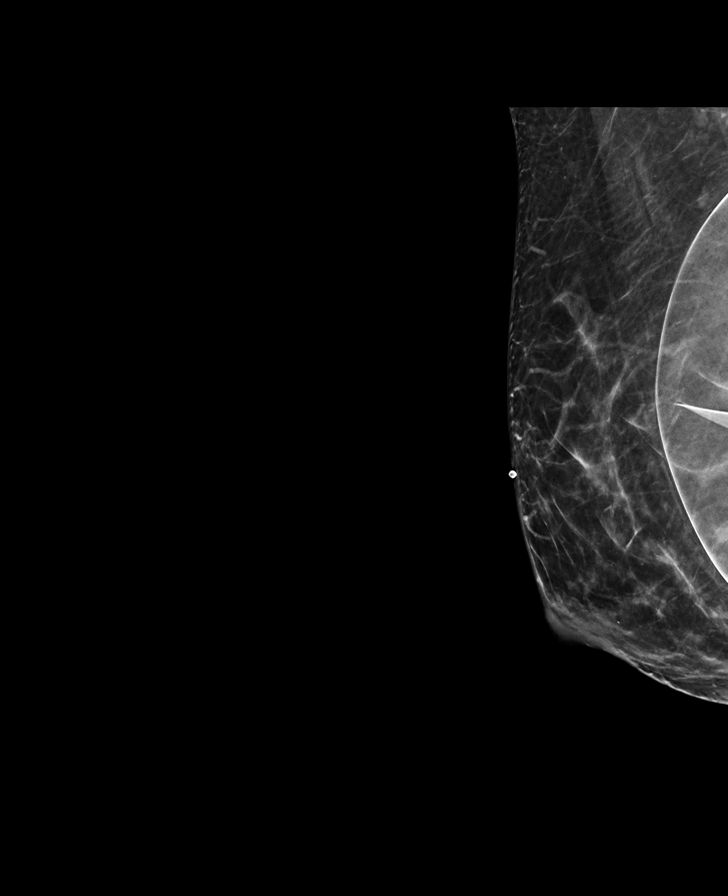

[L MLO synth-2D]
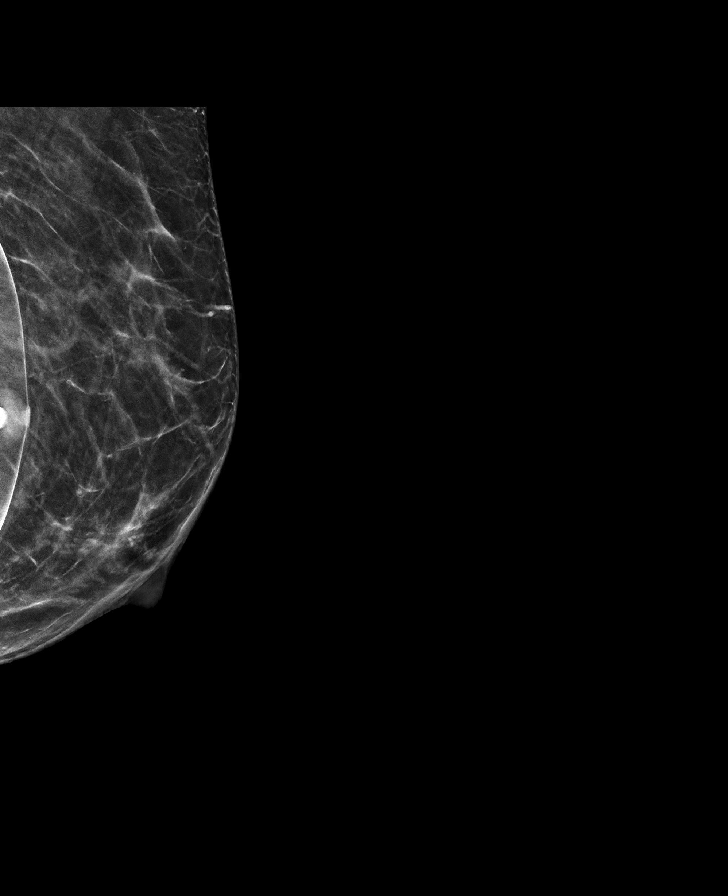

[L CC synth-2D]
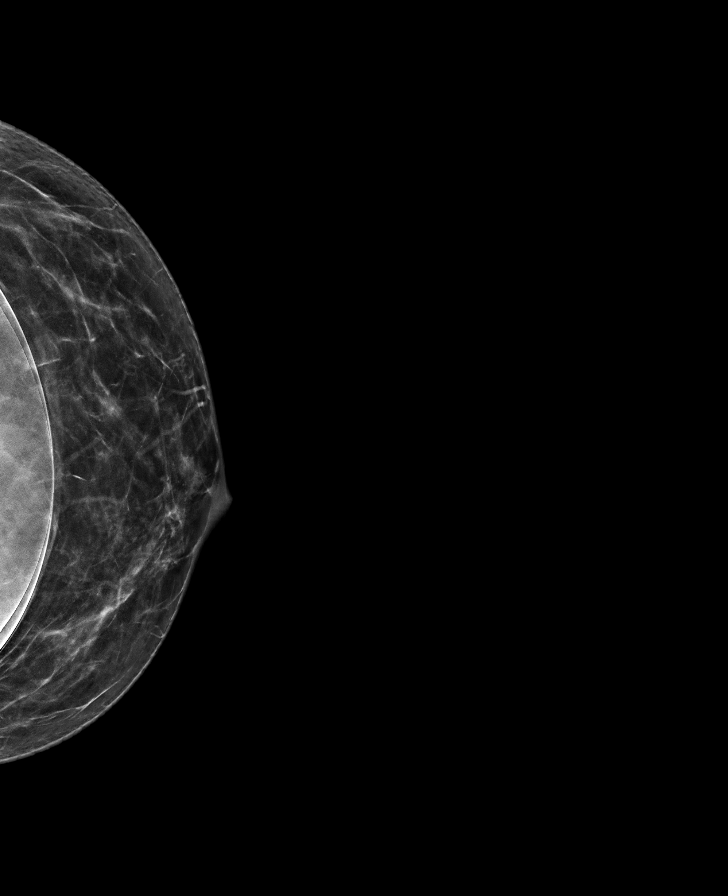

[R CC synth-2D]
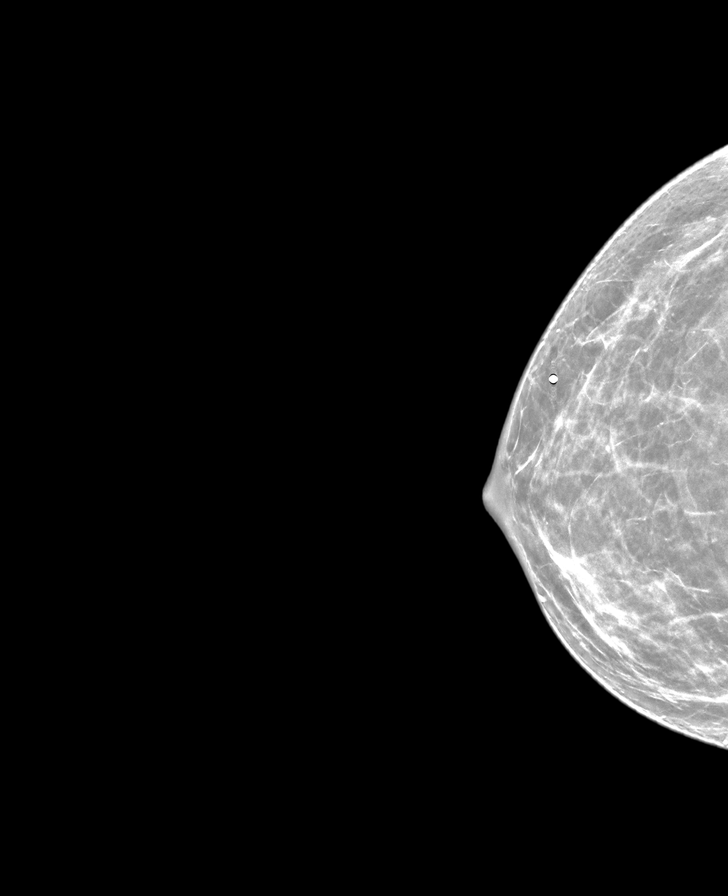

[8 of 34 positions shown; findings below may reference images not displayed]

ACR Breast Density Category b: There are scattered areas of
fibroglandular density.
FINDINGS: The patient has retropectoral saline implants. Standard 2D full
field CC and MLO views of both breasts and tomosynthesis and
synthesized implant displaced CC and MLO views of both breasts were
obtained. Tomosynthesis and synthesized spot compression tangential
view of the palpable concern in the RIGHT breast was also obtained.

No mammographic abnormality in the area of palpable concern in the
UPPER OUTER periareolar RIGHT breast. No findings suspicious for
malignancy in the RIGHT breast.

No findings suspicious for malignancy in the LEFT breast.

Mammographic images were processed with CAD.

On correlative physical exam, there is a palpable ridge of tissue in
the 11 o'clock periareolar location of the RIGHT breast
corresponding to what the patient is feeling, though I do not
palpate a discrete mass. There is mild erythema of the skin
overlying this location, though the patient states that she caused
this discoloration due to palpation over multiple episodes.

Targeted RIGHT breast ultrasound is performed in the area of
palpable concern, showing a prominent subcutaneous fat lobule and
normal scattered fibroglandular tissue at the 11 o'clock position
approximately 2 cm from the nipple. No cyst, solid mass or abnormal
acoustic shadowing is identified.
IMPRESSION: 1. No mammographic or sonographic evidence of malignancy involving
the RIGHT breast.
2. No mammographic evidence of malignancy involving the LEFT breast.

RECOMMENDATION:
Screening mammogram at age 38 unless there are persistent or
intervening clinical concerns. (Code:70-B-O4H)

I have discussed the findings and recommendations with the patient.
If applicable, a reminder letter will be sent to the patient
regarding the next appointment.

BI-RADS CATEGORY  1: Negative.

## 2020-12-31 DIAGNOSIS — J020 Streptococcal pharyngitis: Secondary | ICD-10-CM | POA: Diagnosis not present

## 2020-12-31 DIAGNOSIS — J029 Acute pharyngitis, unspecified: Secondary | ICD-10-CM | POA: Diagnosis not present

## 2021-01-01 DIAGNOSIS — J020 Streptococcal pharyngitis: Secondary | ICD-10-CM | POA: Diagnosis not present

## 2021-01-27 DIAGNOSIS — N3000 Acute cystitis without hematuria: Secondary | ICD-10-CM | POA: Diagnosis not present

## 2021-01-27 DIAGNOSIS — R300 Dysuria: Secondary | ICD-10-CM | POA: Diagnosis not present

## 2021-05-04 DIAGNOSIS — J020 Streptococcal pharyngitis: Secondary | ICD-10-CM | POA: Diagnosis not present

## 2021-05-04 DIAGNOSIS — J029 Acute pharyngitis, unspecified: Secondary | ICD-10-CM | POA: Diagnosis not present

## 2021-07-18 DIAGNOSIS — D2372 Other benign neoplasm of skin of left lower limb, including hip: Secondary | ICD-10-CM | POA: Diagnosis not present

## 2021-07-18 DIAGNOSIS — Z85828 Personal history of other malignant neoplasm of skin: Secondary | ICD-10-CM | POA: Diagnosis not present

## 2021-07-18 DIAGNOSIS — L814 Other melanin hyperpigmentation: Secondary | ICD-10-CM | POA: Diagnosis not present

## 2021-07-18 DIAGNOSIS — L821 Other seborrheic keratosis: Secondary | ICD-10-CM | POA: Diagnosis not present

## 2021-08-04 DIAGNOSIS — Z8742 Personal history of other diseases of the female genital tract: Secondary | ICD-10-CM | POA: Diagnosis not present

## 2021-08-04 DIAGNOSIS — Z6834 Body mass index (BMI) 34.0-34.9, adult: Secondary | ICD-10-CM | POA: Diagnosis not present

## 2021-08-04 DIAGNOSIS — Z01419 Encounter for gynecological examination (general) (routine) without abnormal findings: Secondary | ICD-10-CM | POA: Diagnosis not present

## 2021-08-04 DIAGNOSIS — Z113 Encounter for screening for infections with a predominantly sexual mode of transmission: Secondary | ICD-10-CM | POA: Diagnosis not present

## 2021-08-04 DIAGNOSIS — Z124 Encounter for screening for malignant neoplasm of cervix: Secondary | ICD-10-CM | POA: Diagnosis not present

## 2021-09-26 DIAGNOSIS — R300 Dysuria: Secondary | ICD-10-CM | POA: Diagnosis not present

## 2021-12-29 DIAGNOSIS — U071 COVID-19: Secondary | ICD-10-CM | POA: Diagnosis not present

## 2021-12-29 DIAGNOSIS — J00 Acute nasopharyngitis [common cold]: Secondary | ICD-10-CM | POA: Diagnosis not present

## 2021-12-29 DIAGNOSIS — R52 Pain, unspecified: Secondary | ICD-10-CM | POA: Diagnosis not present

## 2021-12-29 DIAGNOSIS — Z20828 Contact with and (suspected) exposure to other viral communicable diseases: Secondary | ICD-10-CM | POA: Diagnosis not present

## 2021-12-29 DIAGNOSIS — R051 Acute cough: Secondary | ICD-10-CM | POA: Diagnosis not present

## 2022-02-10 DIAGNOSIS — F4323 Adjustment disorder with mixed anxiety and depressed mood: Secondary | ICD-10-CM | POA: Diagnosis not present

## 2022-03-19 DIAGNOSIS — F4323 Adjustment disorder with mixed anxiety and depressed mood: Secondary | ICD-10-CM | POA: Diagnosis not present

## 2022-04-01 DIAGNOSIS — F4323 Adjustment disorder with mixed anxiety and depressed mood: Secondary | ICD-10-CM | POA: Diagnosis not present

## 2022-04-17 DIAGNOSIS — F4323 Adjustment disorder with mixed anxiety and depressed mood: Secondary | ICD-10-CM | POA: Diagnosis not present

## 2022-04-23 DIAGNOSIS — F4321 Adjustment disorder with depressed mood: Secondary | ICD-10-CM | POA: Diagnosis not present

## 2022-04-27 DIAGNOSIS — F4323 Adjustment disorder with mixed anxiety and depressed mood: Secondary | ICD-10-CM | POA: Diagnosis not present

## 2022-05-04 DIAGNOSIS — F4321 Adjustment disorder with depressed mood: Secondary | ICD-10-CM | POA: Diagnosis not present

## 2022-05-16 DIAGNOSIS — F4321 Adjustment disorder with depressed mood: Secondary | ICD-10-CM | POA: Diagnosis not present

## 2022-05-20 DIAGNOSIS — F4323 Adjustment disorder with mixed anxiety and depressed mood: Secondary | ICD-10-CM | POA: Diagnosis not present

## 2022-06-01 DIAGNOSIS — F4321 Adjustment disorder with depressed mood: Secondary | ICD-10-CM | POA: Diagnosis not present

## 2022-06-15 DIAGNOSIS — M9901 Segmental and somatic dysfunction of cervical region: Secondary | ICD-10-CM | POA: Diagnosis not present

## 2022-06-15 DIAGNOSIS — M9902 Segmental and somatic dysfunction of thoracic region: Secondary | ICD-10-CM | POA: Diagnosis not present

## 2022-06-15 DIAGNOSIS — M7918 Myalgia, other site: Secondary | ICD-10-CM | POA: Diagnosis not present

## 2022-06-15 DIAGNOSIS — M5412 Radiculopathy, cervical region: Secondary | ICD-10-CM | POA: Diagnosis not present

## 2022-06-15 DIAGNOSIS — F4321 Adjustment disorder with depressed mood: Secondary | ICD-10-CM | POA: Diagnosis not present

## 2022-06-18 DIAGNOSIS — R051 Acute cough: Secondary | ICD-10-CM | POA: Diagnosis not present

## 2022-06-18 DIAGNOSIS — Z6832 Body mass index (BMI) 32.0-32.9, adult: Secondary | ICD-10-CM | POA: Diagnosis not present

## 2022-06-18 DIAGNOSIS — J029 Acute pharyngitis, unspecified: Secondary | ICD-10-CM | POA: Diagnosis not present

## 2022-06-18 DIAGNOSIS — J00 Acute nasopharyngitis [common cold]: Secondary | ICD-10-CM | POA: Diagnosis not present

## 2022-06-24 DIAGNOSIS — M7918 Myalgia, other site: Secondary | ICD-10-CM | POA: Diagnosis not present

## 2022-06-24 DIAGNOSIS — M9901 Segmental and somatic dysfunction of cervical region: Secondary | ICD-10-CM | POA: Diagnosis not present

## 2022-06-24 DIAGNOSIS — M5412 Radiculopathy, cervical region: Secondary | ICD-10-CM | POA: Diagnosis not present

## 2022-06-24 DIAGNOSIS — M9902 Segmental and somatic dysfunction of thoracic region: Secondary | ICD-10-CM | POA: Diagnosis not present

## 2022-06-26 DIAGNOSIS — F4323 Adjustment disorder with mixed anxiety and depressed mood: Secondary | ICD-10-CM | POA: Diagnosis not present

## 2022-06-30 DIAGNOSIS — F4321 Adjustment disorder with depressed mood: Secondary | ICD-10-CM | POA: Diagnosis not present

## 2022-07-13 DIAGNOSIS — F4323 Adjustment disorder with mixed anxiety and depressed mood: Secondary | ICD-10-CM | POA: Diagnosis not present

## 2022-07-20 DIAGNOSIS — F4323 Adjustment disorder with mixed anxiety and depressed mood: Secondary | ICD-10-CM | POA: Diagnosis not present

## 2022-07-22 DIAGNOSIS — R053 Chronic cough: Secondary | ICD-10-CM | POA: Diagnosis not present

## 2022-07-22 DIAGNOSIS — R079 Chest pain, unspecified: Secondary | ICD-10-CM | POA: Diagnosis not present

## 2022-07-22 DIAGNOSIS — J189 Pneumonia, unspecified organism: Secondary | ICD-10-CM | POA: Diagnosis not present

## 2022-07-23 DIAGNOSIS — D2372 Other benign neoplasm of skin of left lower limb, including hip: Secondary | ICD-10-CM | POA: Diagnosis not present

## 2022-07-23 DIAGNOSIS — D485 Neoplasm of uncertain behavior of skin: Secondary | ICD-10-CM | POA: Diagnosis not present

## 2022-07-23 DIAGNOSIS — L814 Other melanin hyperpigmentation: Secondary | ICD-10-CM | POA: Diagnosis not present

## 2022-07-23 DIAGNOSIS — Z85828 Personal history of other malignant neoplasm of skin: Secondary | ICD-10-CM | POA: Diagnosis not present

## 2022-07-23 DIAGNOSIS — D2271 Melanocytic nevi of right lower limb, including hip: Secondary | ICD-10-CM | POA: Diagnosis not present

## 2022-07-23 DIAGNOSIS — D0461 Carcinoma in situ of skin of right upper limb, including shoulder: Secondary | ICD-10-CM | POA: Diagnosis not present

## 2022-07-23 DIAGNOSIS — D225 Melanocytic nevi of trunk: Secondary | ICD-10-CM | POA: Diagnosis not present

## 2022-07-24 DIAGNOSIS — J189 Pneumonia, unspecified organism: Secondary | ICD-10-CM | POA: Diagnosis not present

## 2022-08-10 DIAGNOSIS — F4323 Adjustment disorder with mixed anxiety and depressed mood: Secondary | ICD-10-CM | POA: Diagnosis not present

## 2022-08-19 DIAGNOSIS — F4323 Adjustment disorder with mixed anxiety and depressed mood: Secondary | ICD-10-CM | POA: Diagnosis not present

## 2022-08-21 DIAGNOSIS — Z124 Encounter for screening for malignant neoplasm of cervix: Secondary | ICD-10-CM | POA: Diagnosis not present

## 2022-08-21 DIAGNOSIS — Z01419 Encounter for gynecological examination (general) (routine) without abnormal findings: Secondary | ICD-10-CM | POA: Diagnosis not present

## 2022-08-21 DIAGNOSIS — Z01411 Encounter for gynecological examination (general) (routine) with abnormal findings: Secondary | ICD-10-CM | POA: Diagnosis not present

## 2022-09-01 DIAGNOSIS — F4323 Adjustment disorder with mixed anxiety and depressed mood: Secondary | ICD-10-CM | POA: Diagnosis not present

## 2022-09-07 DIAGNOSIS — F4321 Adjustment disorder with depressed mood: Secondary | ICD-10-CM | POA: Diagnosis not present

## 2022-09-17 DIAGNOSIS — F4323 Adjustment disorder with mixed anxiety and depressed mood: Secondary | ICD-10-CM | POA: Diagnosis not present

## 2022-09-28 DIAGNOSIS — F4323 Adjustment disorder with mixed anxiety and depressed mood: Secondary | ICD-10-CM | POA: Diagnosis not present

## 2022-09-28 DIAGNOSIS — F4321 Adjustment disorder with depressed mood: Secondary | ICD-10-CM | POA: Diagnosis not present

## 2022-10-05 DIAGNOSIS — F4323 Adjustment disorder with mixed anxiety and depressed mood: Secondary | ICD-10-CM | POA: Diagnosis not present

## 2022-10-12 DIAGNOSIS — F4323 Adjustment disorder with mixed anxiety and depressed mood: Secondary | ICD-10-CM | POA: Diagnosis not present

## 2022-10-20 DIAGNOSIS — F4323 Adjustment disorder with mixed anxiety and depressed mood: Secondary | ICD-10-CM | POA: Diagnosis not present

## 2022-11-03 DIAGNOSIS — F4323 Adjustment disorder with mixed anxiety and depressed mood: Secondary | ICD-10-CM | POA: Diagnosis not present

## 2022-11-09 DIAGNOSIS — F4323 Adjustment disorder with mixed anxiety and depressed mood: Secondary | ICD-10-CM | POA: Diagnosis not present

## 2022-11-23 DIAGNOSIS — F4323 Adjustment disorder with mixed anxiety and depressed mood: Secondary | ICD-10-CM | POA: Diagnosis not present

## 2022-11-26 DIAGNOSIS — F4321 Adjustment disorder with depressed mood: Secondary | ICD-10-CM | POA: Diagnosis not present

## 2022-11-26 DIAGNOSIS — F4323 Adjustment disorder with mixed anxiety and depressed mood: Secondary | ICD-10-CM | POA: Diagnosis not present

## 2022-11-30 DIAGNOSIS — F4323 Adjustment disorder with mixed anxiety and depressed mood: Secondary | ICD-10-CM | POA: Diagnosis not present

## 2022-12-07 DIAGNOSIS — F4323 Adjustment disorder with mixed anxiety and depressed mood: Secondary | ICD-10-CM | POA: Diagnosis not present

## 2022-12-07 DIAGNOSIS — F4321 Adjustment disorder with depressed mood: Secondary | ICD-10-CM | POA: Diagnosis not present

## 2022-12-16 DIAGNOSIS — Z6832 Body mass index (BMI) 32.0-32.9, adult: Secondary | ICD-10-CM | POA: Diagnosis not present

## 2022-12-16 DIAGNOSIS — J0110 Acute frontal sinusitis, unspecified: Secondary | ICD-10-CM | POA: Diagnosis not present

## 2023-01-01 DIAGNOSIS — F4321 Adjustment disorder with depressed mood: Secondary | ICD-10-CM | POA: Diagnosis not present

## 2023-01-04 DIAGNOSIS — F4323 Adjustment disorder with mixed anxiety and depressed mood: Secondary | ICD-10-CM | POA: Diagnosis not present

## 2023-01-12 DIAGNOSIS — F4323 Adjustment disorder with mixed anxiety and depressed mood: Secondary | ICD-10-CM | POA: Diagnosis not present

## 2023-01-18 DIAGNOSIS — F4321 Adjustment disorder with depressed mood: Secondary | ICD-10-CM | POA: Diagnosis not present

## 2023-02-08 DIAGNOSIS — F4323 Adjustment disorder with mixed anxiety and depressed mood: Secondary | ICD-10-CM | POA: Diagnosis not present

## 2023-02-09 ENCOUNTER — Other Ambulatory Visit (HOSPITAL_COMMUNITY): Payer: Self-pay | Admitting: Family Medicine

## 2023-02-09 DIAGNOSIS — K439 Ventral hernia without obstruction or gangrene: Secondary | ICD-10-CM

## 2023-02-11 ENCOUNTER — Ambulatory Visit (HOSPITAL_COMMUNITY)
Admission: RE | Admit: 2023-02-11 | Discharge: 2023-02-11 | Disposition: A | Discharge status: Home / Self Care | Payer: BC Managed Care – PPO | Source: Ambulatory Visit | Attending: Family Medicine | Admitting: Family Medicine

## 2023-02-11 DIAGNOSIS — K439 Ventral hernia without obstruction or gangrene: Secondary | ICD-10-CM | POA: Diagnosis not present

## 2023-03-01 DIAGNOSIS — F4323 Adjustment disorder with mixed anxiety and depressed mood: Secondary | ICD-10-CM | POA: Diagnosis not present

## 2023-03-01 DIAGNOSIS — K429 Umbilical hernia without obstruction or gangrene: Secondary | ICD-10-CM | POA: Diagnosis not present

## 2023-03-04 DIAGNOSIS — F4323 Adjustment disorder with mixed anxiety and depressed mood: Secondary | ICD-10-CM | POA: Diagnosis not present

## 2023-03-08 DIAGNOSIS — F4323 Adjustment disorder with mixed anxiety and depressed mood: Secondary | ICD-10-CM | POA: Diagnosis not present

## 2023-03-18 DIAGNOSIS — F4323 Adjustment disorder with mixed anxiety and depressed mood: Secondary | ICD-10-CM | POA: Diagnosis not present

## 2023-03-22 DIAGNOSIS — F4323 Adjustment disorder with mixed anxiety and depressed mood: Secondary | ICD-10-CM | POA: Diagnosis not present

## 2023-03-29 DIAGNOSIS — F4323 Adjustment disorder with mixed anxiety and depressed mood: Secondary | ICD-10-CM | POA: Diagnosis not present

## 2023-04-12 DIAGNOSIS — F4323 Adjustment disorder with mixed anxiety and depressed mood: Secondary | ICD-10-CM | POA: Diagnosis not present

## 2023-04-20 DIAGNOSIS — F4323 Adjustment disorder with mixed anxiety and depressed mood: Secondary | ICD-10-CM | POA: Diagnosis not present

## 2023-05-03 DIAGNOSIS — F4323 Adjustment disorder with mixed anxiety and depressed mood: Secondary | ICD-10-CM | POA: Diagnosis not present

## 2023-05-09 DIAGNOSIS — Z681 Body mass index (BMI) 19 or less, adult: Secondary | ICD-10-CM | POA: Diagnosis not present

## 2023-05-09 DIAGNOSIS — R0982 Postnasal drip: Secondary | ICD-10-CM | POA: Diagnosis not present

## 2023-05-09 DIAGNOSIS — H9202 Otalgia, left ear: Secondary | ICD-10-CM | POA: Diagnosis not present

## 2023-05-10 DIAGNOSIS — F4323 Adjustment disorder with mixed anxiety and depressed mood: Secondary | ICD-10-CM | POA: Diagnosis not present

## 2023-05-17 DIAGNOSIS — F4323 Adjustment disorder with mixed anxiety and depressed mood: Secondary | ICD-10-CM | POA: Diagnosis not present

## 2023-07-01 ENCOUNTER — Other Ambulatory Visit: Payer: Self-pay | Admitting: Obstetrics

## 2023-07-01 DIAGNOSIS — Z1231 Encounter for screening mammogram for malignant neoplasm of breast: Secondary | ICD-10-CM

## 2023-07-07 ENCOUNTER — Ambulatory Visit

## 2023-07-13 ENCOUNTER — Ambulatory Visit
Admission: RE | Admit: 2023-07-13 | Discharge: 2023-07-13 | Disposition: A | Discharge status: Home / Self Care | Source: Ambulatory Visit | Attending: Obstetrics | Admitting: Obstetrics

## 2023-07-13 DIAGNOSIS — E66812 Obesity, class 2: Secondary | ICD-10-CM | POA: Diagnosis not present

## 2023-07-13 DIAGNOSIS — Z1231 Encounter for screening mammogram for malignant neoplasm of breast: Secondary | ICD-10-CM

## 2023-07-19 ENCOUNTER — Other Ambulatory Visit: Payer: Self-pay | Admitting: Obstetrics

## 2023-07-19 DIAGNOSIS — R928 Other abnormal and inconclusive findings on diagnostic imaging of breast: Secondary | ICD-10-CM

## 2023-07-20 ENCOUNTER — Ambulatory Visit
Admission: RE | Admit: 2023-07-20 | Discharge: 2023-07-20 | Disposition: A | Discharge status: Home / Self Care | Source: Ambulatory Visit | Attending: Obstetrics | Admitting: Obstetrics

## 2023-07-20 ENCOUNTER — Other Ambulatory Visit: Payer: Self-pay | Admitting: Obstetrics

## 2023-07-20 DIAGNOSIS — N6310 Unspecified lump in the right breast, unspecified quadrant: Secondary | ICD-10-CM

## 2023-07-20 DIAGNOSIS — R928 Other abnormal and inconclusive findings on diagnostic imaging of breast: Secondary | ICD-10-CM

## 2023-07-20 DIAGNOSIS — N6312 Unspecified lump in the right breast, upper inner quadrant: Secondary | ICD-10-CM | POA: Diagnosis not present

## 2023-07-22 ENCOUNTER — Other Ambulatory Visit: Payer: Self-pay | Admitting: Obstetrics

## 2023-07-22 ENCOUNTER — Ambulatory Visit
Admission: RE | Admit: 2023-07-22 | Discharge: 2023-07-22 | Disposition: A | Discharge status: Home / Self Care | Source: Ambulatory Visit | Attending: Obstetrics | Admitting: Obstetrics

## 2023-07-22 DIAGNOSIS — N6310 Unspecified lump in the right breast, unspecified quadrant: Secondary | ICD-10-CM

## 2023-07-22 DIAGNOSIS — D241 Benign neoplasm of right breast: Secondary | ICD-10-CM | POA: Diagnosis not present

## 2023-07-22 DIAGNOSIS — N6312 Unspecified lump in the right breast, upper inner quadrant: Secondary | ICD-10-CM | POA: Diagnosis not present

## 2023-07-22 HISTORY — PX: BREAST BIOPSY: SHX20

## 2023-07-23 DIAGNOSIS — D2271 Melanocytic nevi of right lower limb, including hip: Secondary | ICD-10-CM | POA: Diagnosis not present

## 2023-07-23 DIAGNOSIS — L814 Other melanin hyperpigmentation: Secondary | ICD-10-CM | POA: Diagnosis not present

## 2023-07-23 DIAGNOSIS — D225 Melanocytic nevi of trunk: Secondary | ICD-10-CM | POA: Diagnosis not present

## 2023-07-23 DIAGNOSIS — Z85828 Personal history of other malignant neoplasm of skin: Secondary | ICD-10-CM | POA: Diagnosis not present

## 2023-07-23 LAB — SURGICAL PATHOLOGY

## 2023-08-13 DIAGNOSIS — Z6835 Body mass index (BMI) 35.0-35.9, adult: Secondary | ICD-10-CM | POA: Diagnosis not present

## 2023-08-13 DIAGNOSIS — E66812 Obesity, class 2: Secondary | ICD-10-CM | POA: Diagnosis not present

## 2023-09-20 DIAGNOSIS — J019 Acute sinusitis, unspecified: Secondary | ICD-10-CM | POA: Diagnosis not present

## 2023-09-20 DIAGNOSIS — R051 Acute cough: Secondary | ICD-10-CM | POA: Diagnosis not present

## 2023-09-20 DIAGNOSIS — Z6832 Body mass index (BMI) 32.0-32.9, adult: Secondary | ICD-10-CM | POA: Diagnosis not present

## 2023-09-23 DIAGNOSIS — J40 Bronchitis, not specified as acute or chronic: Secondary | ICD-10-CM | POA: Diagnosis not present

## 2023-09-23 DIAGNOSIS — R062 Wheezing: Secondary | ICD-10-CM | POA: Diagnosis not present

## 2023-09-23 DIAGNOSIS — R051 Acute cough: Secondary | ICD-10-CM | POA: Diagnosis not present

## 2023-10-01 DIAGNOSIS — J411 Mucopurulent chronic bronchitis: Secondary | ICD-10-CM | POA: Diagnosis not present

## 2023-10-01 DIAGNOSIS — R053 Chronic cough: Secondary | ICD-10-CM | POA: Diagnosis not present

## 2023-11-08 DIAGNOSIS — Z13220 Encounter for screening for lipoid disorders: Secondary | ICD-10-CM | POA: Diagnosis not present

## 2023-11-08 DIAGNOSIS — Z01419 Encounter for gynecological examination (general) (routine) without abnormal findings: Secondary | ICD-10-CM | POA: Diagnosis not present

## 2023-11-08 DIAGNOSIS — Z1331 Encounter for screening for depression: Secondary | ICD-10-CM | POA: Diagnosis not present

## 2023-11-08 DIAGNOSIS — Z0000 Encounter for general adult medical examination without abnormal findings: Secondary | ICD-10-CM | POA: Diagnosis not present

## 2023-11-08 DIAGNOSIS — Z1329 Encounter for screening for other suspected endocrine disorder: Secondary | ICD-10-CM | POA: Diagnosis not present

## 2023-11-15 DIAGNOSIS — H66001 Acute suppurative otitis media without spontaneous rupture of ear drum, right ear: Secondary | ICD-10-CM | POA: Diagnosis not present

## 2023-11-15 DIAGNOSIS — Z6833 Body mass index (BMI) 33.0-33.9, adult: Secondary | ICD-10-CM | POA: Diagnosis not present

## 2023-11-15 DIAGNOSIS — J0140 Acute pansinusitis, unspecified: Secondary | ICD-10-CM | POA: Diagnosis not present

## 2023-11-15 DIAGNOSIS — R051 Acute cough: Secondary | ICD-10-CM | POA: Diagnosis not present
